# Patient Record
Sex: Male | Born: 1955 | Race: White | Hispanic: No | Marital: Married | Smoking: Never smoker
Health system: Southern US, Community
[De-identification: ages and names within clinical notes are randomized; demographics above are authoritative.]

## PROBLEM LIST (undated history)

## (undated) DIAGNOSIS — M1990 Unspecified osteoarthritis, unspecified site: Secondary | ICD-10-CM

## (undated) HISTORY — PX: COLONOSCOPY: SHX174

## (undated) HISTORY — PX: POLYPECTOMY: SHX149

## (undated) HISTORY — PX: OTHER SURGICAL HISTORY: SHX169

## (undated) HISTORY — DX: Unspecified osteoarthritis, unspecified site: M19.90

---

## 2007-05-18 ENCOUNTER — Ambulatory Visit: Payer: Self-pay | Admitting: Gastroenterology

## 2007-05-24 ENCOUNTER — Encounter: Payer: Self-pay | Admitting: Gastroenterology

## 2007-05-24 ENCOUNTER — Ambulatory Visit: Payer: Self-pay | Admitting: Gastroenterology

## 2007-06-05 ENCOUNTER — Encounter: Payer: Self-pay | Admitting: Gastroenterology

## 2008-12-10 ENCOUNTER — Ambulatory Visit: Payer: Self-pay | Admitting: Diagnostic Radiology

## 2008-12-10 ENCOUNTER — Emergency Department (HOSPITAL_BASED_OUTPATIENT_CLINIC_OR_DEPARTMENT_OTHER): Admission: EM | Admit: 2008-12-10 | Discharge: 2008-12-10 | Payer: Self-pay | Admitting: Emergency Medicine

## 2010-05-13 LAB — POCT CARDIAC MARKERS
Myoglobin, poc: 40.9 ng/mL (ref 12–200)
Myoglobin, poc: 58.9 ng/mL (ref 12–200)

## 2010-05-13 LAB — DIFFERENTIAL
Basophils Relative: 1 % (ref 0–1)
Eosinophils Relative: 3 % (ref 0–5)
Lymphs Abs: 2.2 10*3/uL (ref 0.7–4.0)
Monocytes Relative: 8 % (ref 3–12)
Neutro Abs: 3.9 10*3/uL (ref 1.7–7.7)

## 2010-05-13 LAB — BASIC METABOLIC PANEL
Calcium: 8.6 mg/dL (ref 8.4–10.5)
Creatinine, Ser: 1 mg/dL (ref 0.4–1.5)
GFR calc Af Amer: >60 mL/min (ref >60)
GFR calc non Af Amer: >60 mL/min (ref >60)
Potassium: 4 mEq/L (ref 3.5–5.1)

## 2010-05-13 LAB — CBC: MCHC: 33.4 g/dL (ref 30.0–36.0)

## 2012-10-10 ENCOUNTER — Ambulatory Visit (AMBULATORY_SURGERY_CENTER): Payer: BC Managed Care – PPO

## 2012-10-10 VITALS — Ht 71.5 in | Wt 199.4 lb

## 2012-10-10 DIAGNOSIS — Z86010 Personal history of colonic polyps: Secondary | ICD-10-CM

## 2012-10-10 MED ORDER — PREPOPIK 10-3.5-12 MG-GM-GM PO PACK: 1.0000 | PACK | ORAL | Status: DC

## 2012-10-11 ENCOUNTER — Encounter: Payer: Self-pay | Admitting: Gastroenterology

## 2012-10-19 ENCOUNTER — Encounter: Payer: BC Managed Care – PPO | Admitting: Gastroenterology

## 2012-11-09 ENCOUNTER — Encounter: Payer: Self-pay | Admitting: *Deleted

## 2012-12-07 ENCOUNTER — Encounter: Payer: BC Managed Care – PPO | Admitting: Gastroenterology

## 2013-01-15 ENCOUNTER — Encounter: Payer: Self-pay | Admitting: Gastroenterology

## 2013-01-15 ENCOUNTER — Ambulatory Visit (AMBULATORY_SURGERY_CENTER): Payer: BC Managed Care – PPO | Admitting: Gastroenterology

## 2013-01-15 VITALS — BP 116/85 | HR 65 | Temp 97.8°F | Resp 15 | Ht 71.0 in | Wt 199.0 lb

## 2013-01-15 DIAGNOSIS — D126 Benign neoplasm of colon, unspecified: Secondary | ICD-10-CM

## 2013-01-15 DIAGNOSIS — K648 Other hemorrhoids: Secondary | ICD-10-CM

## 2013-01-15 DIAGNOSIS — Z86010 Personal history of colonic polyps: Secondary | ICD-10-CM

## 2013-01-15 HISTORY — PX: COLONOSCOPY: SHX174

## 2013-01-15 MED ORDER — SODIUM CHLORIDE 0.9 % IV SOLN: 500.0000 mL | INTRAVENOUS | Status: DC

## 2013-01-15 NOTE — Progress Notes (Signed)
Patient did not experience any of the following events: a burn prior to discharge; a fall within the facility; wrong site/side/patient/procedure/implant event; or a hospital transfer or hospital admission upon discharge from the facility. (G8907) Patient did not have preoperative order for IV antibiotic SSI prophylaxis. (G8918)  

## 2013-01-15 NOTE — Progress Notes (Signed)
Procedure ends, to recovery, report given and VSS. 

## 2013-01-15 NOTE — Progress Notes (Signed)
Called to room to assist during endoscopic procedure.  Patient ID and intended procedure confirmed with present staff. Received instructions for my participation in the procedure from the performing physician.  

## 2013-01-15 NOTE — Op Note (Signed)
[facility] [address] [place], [address]   COLONOSCOPY PROCEDURE REPORT  PATIENT: Derek, [name]  MR#: [id number] BIRTHDATE: 1955/08/10 , 57  yrs. old GENDER: Male ENDOSCOPIST: Meryl Dare, MD, [facility] PROCEDURE DATE:  01/15/2013 PROCEDURE:   Colonoscopy with biopsy, snare polypectomy, hemorrhoidectomy via sclerosing First Screening Colonoscopy - Avg.  risk and is 50 yrs.  old or older - No.  Prior Negative Screening - Now for repeat screening. N/A  History of Adenoma - Now for follow-up colonoscopy & has been > or = to 3 yrs.  Yes hx of adenoma.  Has been 3 or more years since last colonoscopy.  Polyps Removed Today? Yes. ASA CLASS:   Class II INDICATIONS:Patient's personal history of adenomatous colon polyps.  MEDICATIONS: MAC sedation, administered by CRNA and propofol (Diprivan) 300mg  IV DESCRIPTION OF PROCEDURE:   After the risks benefits and alternatives of the procedure were thoroughly explained, informed consent was obtained.  A digital rectal exam revealed no abnormalities of the rectum.   The LB [id number]  endoscope was introduced through the anus and advanced to the cecum, which was identified by both the appendix and ileocecal valve. No adverse events experienced.   The quality of the prep was Prepopik good The instrument was then slowly withdrawn as the colon was fully examined.  COLON FINDINGS: A sessile polyp measuring 5 mm in size was found at the cecum.  A polypectomy was performed with a cold snare.  The resection was complete and the polyp tissue was completely retrieved.   A sessile polyp measuring 4 mm in size was found in the descending colon.  A polypectomy was performed with a cold snare.  The resection was complete and the polyp tissue was completely retrieved.   Two sessile polyps measuring 4-5 mm in size were found in the sigmoid colon.  A polypectomy was performed with a cold snare and with cold forceps.  The resection  was complete and the polyp tissue was completely retrieved.   A sessile polyp measuring 7 mm in size was found in the rectum.  A polypectomy was performed with a cold snare.  The resection was complete and the polyp tissue was completely retrieved.   The colon was otherwise normal.  There was no diverticulosis, inflammation, polyps or cancers unless previously stated.  Retroflexed views revealed small internal hemorrhoids. Given his recent rectal bleeding decision made to proceed with sclerosis. 1.5 cc of 23.4% saline was injected into the internal hemorrhoids well above the dentate line. The time to cecum=1 minutes 58 seconds.  Withdrawal time=15 minutes 57 seconds.  The scope was withdrawn and the procedure completed. COMPLICATIONS: There were no complications.  ENDOSCOPIC IMPRESSION: 1.   Sessile polyp measuring 5 mm at the cecum; polypectomy was performed with a cold snare 2.   Sessile polyp measuring 4 mm in the descending colon; polypectomy was performed with a cold snare 3.   Two sessile polyps measuring 4-5 mm in the sigmoid colon; polypectomy performed with a cold snare and with cold forceps 4.   Sessile polyp measuring 7 mm in the rectum; polypectomy performed with a cold snare 5.    Internal hemorrhoids; injection sclerosis performed  RECOMMENDATIONS: 1.  Await pathology results 2.  Repeat Colonoscopy in 3-5 years pending pathology review.   eSigned:  Meryl Dare, MD, [facility] 01/15/2013 2:06 PM   cc: Lindaann Slough, MD   PATIENT NAME:  Derek, [name] MR#: [id number]

## 2013-01-15 NOTE — Patient Instructions (Signed)
Discharge instructions given with verbal understanding. Handouts on polyps and hemorrhoids. Resume previous medications. YOU HAD AN ENDOSCOPIC PROCEDURE TODAY AT THE [facility]: Refer to the procedure report that was given to you for any specific questions about what was found during the examination.  If the procedure report does not answer your questions, please call your gastroenterologist to clarify.  If you requested that your care partner not be given the details of your procedure findings, then the procedure report has been included in a sealed envelope for you to review at your convenience later.  YOU SHOULD EXPECT: Some feelings of bloating in the abdomen. Passage of more gas than usual.  Walking can help get rid of the air that was put into your GI tract during the procedure and reduce the bloating. If you had a lower endoscopy (such as a colonoscopy or flexible sigmoidoscopy) you may notice spotting of blood in your stool or on the toilet paper. If you underwent a bowel prep for your procedure, then you may not have a normal bowel movement for a few days.  DIET: Your first meal following the procedure should be a light meal and then it is ok to progress to your normal diet.  A half-sandwich or bowl of soup is an example of a good first meal.  Heavy or fried foods are harder to digest and may make you feel nauseous or bloated.  Likewise meals heavy in dairy and vegetables can cause extra gas to form and this can also increase the bloating.  Drink plenty of fluids but you should avoid alcoholic beverages for 24 hours.  ACTIVITY: Your care partner should take you home directly after the procedure.  You should plan to take it easy, moving slowly for the rest of the day.  You can resume normal activity the day after the procedure however you should NOT DRIVE or use heavy machinery for 24 hours (because of the sedation medicines used during the test).    SYMPTOMS TO REPORT  IMMEDIATELY: A gastroenterologist can be reached at any hour.  During normal business hours, 8:30 AM to 5:00 PM Monday through Friday, call [phone].  After hours and on weekends, please call the GI answering service at [phone] who will take a message and have the physician on call contact you.   Following lower endoscopy (colonoscopy or flexible sigmoidoscopy):  Excessive amounts of blood in the stool  Significant tenderness or worsening of abdominal pains  Swelling of the abdomen that is new, acute  Fever of 100F or higher  FOLLOW UP: If any biopsies were taken you will be contacted by phone or by letter within the next 1-3 weeks.  Call your gastroenterologist if you have not heard about the biopsies in 3 weeks.  Our staff will call the home number listed on your records the next business day following your procedure to check on you and address any questions or concerns that you may have at that time regarding the information given to you following your procedure. This is a courtesy call and so if there is no answer at the home number and we have not heard from you through the emergency physician on call, we will assume that you have returned to your regular daily activities without incident.  SIGNATURES/CONFIDENTIALITY: You and/or your care partner have signed paperwork which will be entered into your electronic medical record.  These signatures attest to the fact that that the information above on your After Visit Summary   has been reviewed and is understood.  Full responsibility of the confidentiality of this discharge information lies with you and/or your care-partner. 

## 2013-01-16 ENCOUNTER — Telehealth: Payer: Self-pay

## 2013-01-16 NOTE — Telephone Encounter (Signed)
  Follow up Call-  Call back number 01/15/2013  Post procedure Call Back phone  # [phone]  Permission to leave phone message Yes     Patient questions:  Do you have a fever, pain , or abdominal swelling? no Pain Score  0 *  Have you tolerated food without any problems? yes  Have you been able to return to your normal activities? yes  Do you have any questions about your discharge instructions: Diet   no Medications  no Follow up visit  no  Do you have questions or concerns about your Care? no  Actions: * If pain score is 4 or above: No action needed, pain <4.

## 2013-01-17 ENCOUNTER — Encounter: Payer: BC Managed Care – PPO | Admitting: Gastroenterology

## 2013-01-23 ENCOUNTER — Encounter: Payer: Self-pay | Admitting: Gastroenterology

## 2014-06-14 ENCOUNTER — Encounter: Payer: Self-pay | Admitting: Gastroenterology

## 2017-01-03 NOTE — Progress Notes (Signed)
[name] [address] [place], [place] [address] Phone: [phone] Subjective:    I'm seeing this patient by the request  of:  Maylon Peppers, MD   CC: Right leg pain  [id number]  Derek [name] is a 61 y.o. male coming in with complaint of  leg pain that started 3 weeks ago. Patient saw his PCP and was told that he has arthritis in his back. He has radicular symptoms on the right leg all the way to his foot, both tingling and numbness. His foot is numb from the 2nd-5th toes as well as the posterior aspect of his hamstring. He has been taking a muscle relaxer Norflex since last week. He does not like the way it makes him feel. He also has had flexogenix shots in the right knee due to arthritis. He stated that the knee still bothers him but is better with activity.  Patient is concerned because he is decreasing his walking child as well.  States not doing well with the over-the-counter medications   Did have x-rays of the lumbar spine done December 29, 2016.  Found to have significant osteoarthritic changes at most levels especially at L4-L5.  No past medical history on file. Past Surgical History:  Procedure Laterality Date  . COLONOSCOPY    . POLYPECTOMY    . thumb surg     right thumb   Social History   Socioeconomic History  . Marital status: Married    Spouse name: None  . Number of children: None  . Years of education: None  . Highest education level: None  Social Needs  . Financial resource strain: None  . Food insecurity - worry: None  . Food insecurity - inability: None  . Transportation needs - medical: None  . Transportation needs - non-medical: None  Occupational History  . None  Tobacco Use  . Smoking status: Never Smoker  . Smokeless tobacco: Never Used  Substance and Sexual Activity  . Alcohol use: Yes    Alcohol/week: 7.2 oz    Types: 12 Cans of beer per week  . Drug use: No  . Sexual activity: None  Other Topics  Concern  . None  Social History Narrative  . None   No Known Allergies Family History  Problem Relation Age of Onset  . Transient ischemic attack Mother      Past medical history, social, surgical and family history all reviewed in electronic medical record.  No pertanent information unless stated regarding to the chief complaint.   Review of Systems:Review of systems updated and as accurate as of 01/04/17  No headache, visual changes, nausea, vomiting, diarrhea, constipation, dizziness, abdominal pain, skin rash, fevers, chills, night sweats, weight loss, swollen lymph nodes, body aches, joint swelling, muscle aches, chest pain, shortness of breath, mood changes.   Objective  Blood pressure 138/82, pulse 94, height 5\' 11"  (1.803 m), weight 206 lb (93.4 kg), SpO2 98 %. Systems examined below as of 01/04/17   General: No apparent distress alert and oriented x3 mood and affect normal, dressed appropriately.  HEENT: Pupils equal, extraocular movements intact  Respiratory: Patient's speak in full sentences and does not appear short of breath  Cardiovascular: No lower extremity edema, non tender, no erythema  Skin: Warm dry intact with no signs of infection or rash on extremities or on axial skeleton.  Abdomen: Soft nontender  Neuro: Cranial nerves II through XII are intact, neurovascularly intact in all extremities with 2+ DTRs  and 2+ pulses.  Lymph: No lymphadenopathy of posterior or anterior cervical chain or axillae bilaterally.  Gait antalgic.  MSK:  Non tender with full range of motion and good stability and symmetric strength and tone of shoulders, elbows, wrist, hip, and ankles bilaterally.  Significant arthritic changes in multiple joints.  Patient does have very minimal internal range of motion with the right hip.  Patient's right knee does have significant arthritis Patient does have some instability of the right leg with walking.  Back exam shows the patient does have some  mild degenerative scoliosis noted.  Positive straight leg test on the right side.  Patient also has some atrophy of the leg on the right side.  This includes the quadricep, hamstring, as well as calf.  Patient does have 4 out of 5 strength of the foot compared to the contralateral side.  Deep tendon reflexes are intact.   Impression and Recommendations:     This case required medical decision making of moderate complexity.      Note: This dictation was prepared with Dragon dictation along with smaller phrase technology. Any transcriptional errors that result from this process are unintentional.

## 2017-01-04 ENCOUNTER — Ambulatory Visit: Payer: Self-pay

## 2017-01-04 ENCOUNTER — Ambulatory Visit: Payer: 59 | Admitting: Family Medicine

## 2017-01-04 ENCOUNTER — Encounter: Payer: Self-pay | Admitting: Family Medicine

## 2017-01-04 VITALS — BP 138/82 | HR 94 | Ht 71.0 in | Wt 206.0 lb

## 2017-01-04 DIAGNOSIS — R531 Weakness: Secondary | ICD-10-CM | POA: Diagnosis not present

## 2017-01-04 DIAGNOSIS — M5416 Radiculopathy, lumbar region: Secondary | ICD-10-CM | POA: Diagnosis not present

## 2017-01-04 DIAGNOSIS — M79606 Pain in leg, unspecified: Secondary | ICD-10-CM

## 2017-01-04 MED ORDER — MELOXICAM 15 MG PO TABS: 15.0000 mg | ORAL_TABLET | Freq: Every day | ORAL | 0 refills | Status: DC

## 2017-01-04 MED ORDER — GABAPENTIN 100 MG PO CAPS: 200.0000 mg | ORAL_CAPSULE | Freq: Every day | ORAL | 3 refills | Status: DC

## 2017-01-04 NOTE — Patient Instructions (Addendum)
Good to see you  We will get MRI soon.  Gabapentin 200mg  at night  Ice 20 minutes 2 times daily. Usually after activity and before bed. meloxicam daily for 10 days I will write you when I get the results and discuss next steps

## 2017-01-04 NOTE — Assessment & Plan Note (Signed)
Patient does have lumbar radiculopathy as well as has atrophy of the musculature of the lower extremity.  Concerned with this as well as the weakness.  Patient has constant numbness of toes 2 through 5.  Patient's x-rays are concerning for an L4-L5 degenerative disc disease that is likely causing some type of nerve impingement.  I do feel that advanced imaging would be warranted because patient would be a candidate for an epidural or possible nerve root injection.  Patient will continue some of the home exercises that was given to him by another provider and also started on gabapentin and meloxicam.  I do feel with the constant numbness and weakness that the advanced imaging is warranted now.  Depending on the findings we will discuss further medical management.

## 2017-01-13 ENCOUNTER — Other Ambulatory Visit: Payer: Self-pay | Admitting: Family Medicine

## 2017-01-15 ENCOUNTER — Ambulatory Visit
Admission: RE | Admit: 2017-01-15 | Discharge: 2017-01-15 | Disposition: A | Discharge status: Home / Self Care | Payer: 59 | Source: Ambulatory Visit | Attending: Family Medicine | Admitting: Family Medicine

## 2017-01-15 DIAGNOSIS — M5416 Radiculopathy, lumbar region: Secondary | ICD-10-CM

## 2017-01-15 DIAGNOSIS — R531 Weakness: Secondary | ICD-10-CM

## 2017-01-20 ENCOUNTER — Encounter: Payer: Self-pay | Admitting: Family Medicine

## 2017-01-20 DIAGNOSIS — M5416 Radiculopathy, lumbar region: Secondary | ICD-10-CM

## 2017-02-10 ENCOUNTER — Encounter: Payer: Self-pay | Admitting: Family Medicine

## 2017-03-01 ENCOUNTER — Ambulatory Visit
Admission: RE | Admit: 2017-03-01 | Discharge: 2017-03-01 | Disposition: A | Discharge status: Home / Self Care | Payer: 59 | Source: Ambulatory Visit | Attending: Family Medicine | Admitting: Family Medicine

## 2017-03-01 DIAGNOSIS — M5416 Radiculopathy, lumbar region: Secondary | ICD-10-CM

## 2017-03-01 MED ORDER — IOPAMIDOL (ISOVUE-M 200) INJECTION 41%
1.0000 mL | Freq: Once | INTRAMUSCULAR | Status: AC
  Administered 2017-03-01: 1 mL via EPIDURAL

## 2017-03-01 MED ORDER — METHYLPREDNISOLONE ACETATE 40 MG/ML INJ SUSP (RADIOLOG
120.0000 mg | Freq: Once | INTRAMUSCULAR | Status: AC
  Administered 2017-03-01: 120 mg via EPIDURAL

## 2017-03-01 NOTE — Discharge Instructions (Signed)

## 2017-05-31 ENCOUNTER — Encounter: Payer: Self-pay | Admitting: Family Medicine

## 2017-11-09 ENCOUNTER — Ambulatory Visit: Payer: 59 | Admitting: Family Medicine

## 2017-11-12 NOTE — Progress Notes (Signed)
[name] [address] [place], [place] [address] Phone: [phone] Subjective:   Derek [name], am serving as a scribe for Dr. Hulan Saas.   CC: Back pain follow-up  [id number]  Derek [name] is a 62 y.o. male coming in with complaint of back pain. Patient said that he had an epidural which did not help. Continues to have pain all the way down the right leg along with numbness in that levg. Constant pain in all positions. Patient also is complaining of left heel pain. Patient does work on a ladder quite a bit. Pain in the heel improves when sedentary.   Patient also complains of right knee pain.  Has severe arthritis in the knee.  Was told he needs replacement.  Wondering what else he can do.  Saw another provider and was given injections with Flexogenix patient did not see improvement with this.  Patient has pain on a daily basis.  Does not know what it is from his knee and what is from his back.     [name] past medical history on file. Past Surgical History:  Procedure Laterality Date  . COLONOSCOPY    . POLYPECTOMY    . thumb surg     right thumb   Social History   Socioeconomic History  . Marital status: Married    Spouse name: Not on file  . Number of children: Not on file  . Years of education: Not on file  . Highest education level: Not on file  Occupational History  . Not on file  Social Needs  . Financial resource strain: Not on file  . Food insecurity:    Worry: Not on file    Inability: Not on file  . Transportation needs:    Medical: Not on file    Non-medical: Not on file  Tobacco Use  . Smoking status: Never Smoker  . Smokeless tobacco: Never Used  Substance and Sexual Activity  . Alcohol use: Yes    Alcohol/week: 12.0 standard drinks    Types: 12 Cans of beer per week  . Drug use: [name]  . Sexual activity: Not on file  Lifestyle  . Physical activity:    Days per week: Not on file    Minutes per session: Not on  file  . Stress: Not on file  Relationships  . Social connections:    Talks on phone: Not on file    Gets together: Not on file    Attends religious service: Not on file    Active member of club or organization: Not on file    Attends meetings of clubs or organizations: Not on file    Relationship status: Not on file  Other Topics Concern  . Not on file  Social History Narrative  . Not on file   [name] Known Allergies Family History  Problem Relation Age of Onset  . Transient ischemic attack Mother        Current Outpatient Medications (Analgesics):  .  meloxicam (MOBIC) 15 MG tablet, Take 1 tablet (15 mg total) by mouth daily.   Current Outpatient Medications (Other):  .  gabapentin (NEURONTIN) 100 MG capsule, Take 2 capsules (200 mg total) by mouth at bedtime. .  orphenadrine (NORFLEX) 100 MG tablet, Take 100 mg by mouth 2 (two) times daily.    Past medical history, social, surgical and family history all reviewed in electronic medical record.  [name] pertanent information unless stated regarding to the chief complaint.  Review of Systems:  [name] headache, visual changes, nausea, vomiting, diarrhea, constipation, dizziness, abdominal pain, skin rash, fevers, chills, night sweats, weight loss, swollen lymph nodes, body aches, joint swelling, chest pain, shortness of breath, mood changes.  Positive muscle aches  Objective  Blood pressure (!) 138/94, pulse 65, height 5\' 11"  (1.803 m), weight 203 lb (92.1 kg), SpO2 98 %.    General: [name] apparent distress alert and oriented x3 mood and affect normal, dressed appropriately.  HEENT: Pupils equal, extraocular movements intact  Respiratory: Patient's speak in full sentences and does not appear short of breath  Cardiovascular: [name] lower extremity edema, non tender, [name] erythema  Skin: Warm dry intact with [name] signs of infection or rash on extremities or on axial skeleton.  Abdomen: Soft nontender  Neuro: Cranial nerves II through XII are  intact, neurovascularly intact in all extremities with 2+ DTRs and 2+ pulses.  Lymph: [name] lymphadenopathy of posterior or anterior cervical chain or axillae bilaterally.  Gait antalgic MSK:  Non tender with full range of motion and good stability and symmetric strength and tone of shoulders, elbows, wrist, hip, and ankles bilaterally.  Knee: Right valgus deformity noted.  Abnormal thigh to calf ratio.  Tender to palpation over medial and PF joint line.  ROM full in flexion and extension and lower leg rotation. instability with valgus force.  painful patellar compression. Patellar glide with moderate crepitus. Patellar and quadriceps tendons unremarkable. Hamstring and quadriceps strength is normal. Contralateral knee shows mild arthritic changes  Back exam shows loss of lordosis.  Tenderness to palpation the paraspinal musculature on the right side of the lumbar spine.  Positive straight leg test on the right side at 20 degrees.  Patient does have some mild weakness with plantar flexion on the right compared to left with 4 out of 5 strength.  Deep tendon reflexes are intact     Impression and Recommendations:     This case required medical decision making of moderate complexity. The above documentation has been reviewed and is accurate and complete Lyndal Pulley, DO       Note: This dictation was prepared with Dragon dictation along with smaller phrase technology. Any transcriptional errors that result from this process are unintentional.

## 2017-11-14 ENCOUNTER — Ambulatory Visit (INDEPENDENT_AMBULATORY_CARE_PROVIDER_SITE_OTHER): Payer: 59 | Admitting: Family Medicine

## 2017-11-14 ENCOUNTER — Encounter: Payer: Self-pay | Admitting: Family Medicine

## 2017-11-14 VITALS — BP 138/94 | HR 65 | Ht 71.0 in | Wt 203.0 lb

## 2017-11-14 DIAGNOSIS — M5416 Radiculopathy, lumbar region: Secondary | ICD-10-CM | POA: Diagnosis not present

## 2017-11-14 DIAGNOSIS — M1711 Unilateral primary osteoarthritis, right knee: Secondary | ICD-10-CM

## 2017-11-14 NOTE — Assessment & Plan Note (Signed)
Significant instability and abnormal thigh to calf ratio.  Custom bracing ordered today.  Patient did try Visco supplementation from an outside facility with no significant improvement.  Patient wants to avoid surgical intervention.  Follow-up again in 3 to 4 weeks and worsening symptoms consider injection.

## 2017-11-14 NOTE — Assessment & Plan Note (Signed)
Patient continues to have lumbar radiculopathy.  Did not respond to the L5 nerve root injection.  We will try and S1 nerve root injection for diagnostic and therapeutic purposes.  Discussed with patient about the potential need for surgical intervention if this continues.  Patient also has significant arthritic changes of the right knee that I think is contributing to some of the discomfort and pain.  We will get patient a custom brace secondary to the abnormal thigh to calf ratio and the instability of the knee.  Discussed patient following up again in 3 to 4 weeks afterwards.  Patient has been noncompliant on medications and will not make any significant changes at this time.

## 2017-11-14 NOTE — Patient Instructions (Signed)
Good to see you  I am sorry you are still hurting. We will get you a custom brace for the knee and see how much that is playing a role Will try another nerve injection.  Call them at [phone] See me again in 3-4 weeks

## 2017-11-24 ENCOUNTER — Ambulatory Visit
Admission: RE | Admit: 2017-11-24 | Discharge: 2017-11-24 | Disposition: A | Discharge status: Home / Self Care | Payer: 59 | Source: Ambulatory Visit | Attending: Family Medicine | Admitting: Family Medicine

## 2017-11-24 DIAGNOSIS — M5416 Radiculopathy, lumbar region: Secondary | ICD-10-CM

## 2017-11-24 MED ORDER — METHYLPREDNISOLONE ACETATE 40 MG/ML INJ SUSP (RADIOLOG
120.0000 mg | Freq: Once | INTRAMUSCULAR | Status: AC
  Administered 2017-11-24: 120 mg via EPIDURAL

## 2017-11-24 MED ORDER — IOPAMIDOL (ISOVUE-M 200) INJECTION 41%
1.0000 mL | Freq: Once | INTRAMUSCULAR | Status: AC
  Administered 2017-11-24: 1 mL via EPIDURAL

## 2017-11-24 NOTE — Discharge Instructions (Signed)

## 2017-12-07 ENCOUNTER — Encounter: Payer: Self-pay | Admitting: Family Medicine

## 2017-12-26 ENCOUNTER — Ambulatory Visit (INDEPENDENT_AMBULATORY_CARE_PROVIDER_SITE_OTHER): Payer: 59 | Admitting: Family Medicine

## 2017-12-26 ENCOUNTER — Encounter: Payer: Self-pay | Admitting: Family Medicine

## 2017-12-26 DIAGNOSIS — M7662 Achilles tendinitis, left leg: Secondary | ICD-10-CM | POA: Diagnosis not present

## 2017-12-26 DIAGNOSIS — M5416 Radiculopathy, lumbar region: Secondary | ICD-10-CM

## 2017-12-26 DIAGNOSIS — M1711 Unilateral primary osteoarthritis, right knee: Secondary | ICD-10-CM

## 2017-12-26 MED ORDER — VITAMIN D (ERGOCALCIFEROL) 1.25 MG (50000 UNIT) PO CAPS: 50000.0000 [IU] | ORAL_CAPSULE | ORAL | 0 refills | Status: DC

## 2017-12-26 NOTE — Assessment & Plan Note (Signed)
Stable at the moment.  Discussed icing regimen and home exercise.  Discussed objective residual which wants to avoid.  Patient did not respond well to the gabapentin.  Encourage patient to continue to be active.  Hopefully will continue to improve.

## 2017-12-26 NOTE — Patient Instructions (Addendum)
Good to see you  Ice is your friend Once weekly vitamin D for 12 weeks Avoid the heel lift Exercises 3 times a week.  pennsaid pinkie amount topically 2 times daily as needed.   The knee looks good  Back is better as well  See me again in 6 weeks

## 2017-12-26 NOTE — Progress Notes (Signed)
[name] [address] [place], [place] [address] Phone: [phone] Subjective:    I Derek [name] am serving as a Education administrator for Dr. Hulan Saas.   CC: Back pain  [id number]  Franko Hilliker is a 62 y.o. male coming in with complaint of back pain. States the back is not so bad today. Hasn't been doing as much walking. Has some numbness in his right foot. Radiating pain has resolved.  Patient has been wearing the knee brace on a more regular basis.  Feels significant improvement.  Great improvement after the epidural of the S1 nerve root.  The injection has helped.  Still has the numbness.     No past medical history on file. Past Surgical History:  Procedure Laterality Date  . COLONOSCOPY    . POLYPECTOMY    . thumb surg     right thumb   Social History   Socioeconomic History  . Marital status: Married    Spouse name: Not on file  . Number of children: Not on file  . Years of education: Not on file  . Highest education level: Not on file  Occupational History  . Not on file  Social Needs  . Financial resource strain: Not on file  . Food insecurity:    Worry: Not on file    Inability: Not on file  . Transportation needs:    Medical: Not on file    Non-medical: Not on file  Tobacco Use  . Smoking status: Never Smoker  . Smokeless tobacco: Never Used  Substance and Sexual Activity  . Alcohol use: Yes    Alcohol/week: 12.0 standard drinks    Types: 12 Cans of beer per week  . Drug use: No  . Sexual activity: Not on file  Lifestyle  . Physical activity:    Days per week: Not on file    Minutes per session: Not on file  . Stress: Not on file  Relationships  . Social connections:    Talks on phone: Not on file    Gets together: Not on file    Attends religious service: Not on file    Active member of club or organization: Not on file    Attends meetings of clubs or organizations: Not on file    Relationship status: Not on  file  Other Topics Concern  . Not on file  Social History Narrative  . Not on file   No Known Allergies Family History  Problem Relation Age of Onset  . Transient ischemic attack Mother          Current Outpatient Medications (Other):  Marland Kitchen  Vitamin D, Ergocalciferol, (DRISDOL) 1.25 MG (50000 UT) CAPS capsule, Take 1 capsule (50,000 Units total) by mouth every 7 (seven) days.    Past medical history, social, surgical and family history all reviewed in electronic medical record.  No pertanent information unless stated regarding to the chief complaint.   Review of Systems:  No headache, visual changes, nausea, vomiting, diarrhea, constipation, dizziness, abdominal pain, skin rash, fevers, chills, night sweats, weight loss, swollen lymph nodes, body aches, joint swelling, chest pain, shortness of breath, mood changes.  Positive muscle aches  Objective  Blood pressure 122/84, pulse 83, height 5\' 11"  (8.657 m), weight 205 lb (93 kg), SpO2 98 %.    General: No apparent distress alert and oriented x3 mood and affect normal, dressed appropriately.  HEENT: Pupils equal, extraocular movements intact  Respiratory: Patient's speak in full  sentences and does not appear short of breath  Cardiovascular: No lower extremity edema, non tender, no erythema  Skin: Warm dry intact with no signs of infection or rash on extremities or on axial skeleton.  Abdomen: Soft nontender  Neuro: Cranial nerves II through XII are intact, neurovascularly intact in all extremities with 2+ DTRs and 2+ pulses.  Lymph: No lymphadenopathy of posterior or anterior cervical chain or axillae bilaterally.  Gait mild antalgic MSK:  Non tender with full range of motion and good stability and symmetric strength and tone of shoulders, elbows, wrist, hip, and bilaterally.  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 Large thigh to calf ratio.  Tender to palpation over medial and  PF joint line.  ROM full in flexion and extension and lower leg rotation. instability with valgus force.  painful patellar compression. Patellar glide with moderate crepitus. Patellar and quadriceps tendons unremarkable. Hamstring and quadriceps strength is normal. Contralateral knee shows mild actinic changes  Back exam shows some degenerative disc disease and tenderness to palpation in the paraspinal musculature.  Decreased range of motion in all planes apparently is 5 to 10 degrees.  Patient still has foot numbness but no significant radicular symptoms noted on exam today.  Seems to have near full strength.  Tightness of the left heel Pain   Impression and Recommendations:     This case required medical decision making of moderate complexity. The above documentation has been reviewed and is accurate and complete Lyndal Pulley, DO       Note: This dictation was prepared with Dragon dictation along with smaller phrase technology. Any transcriptional errors that result from this process are unintentional.

## 2017-12-26 NOTE — Assessment & Plan Note (Signed)
Arthritic changes noted as well.  Continue non-custom bracing.  Patient does not want injections at this point.  Continue conservative therapy otherwise

## 2017-12-26 NOTE — Assessment & Plan Note (Signed)
Discussed icing regimen, home exercises, which activities to do which was to void.  Increase activity slowly.  Follow-up again in 3 to 4 weeks

## 2018-01-09 ENCOUNTER — Encounter: Payer: Self-pay | Admitting: Gastroenterology

## 2018-02-12 ENCOUNTER — Encounter: Payer: Self-pay | Admitting: Gastroenterology

## 2018-02-14 ENCOUNTER — Ambulatory Visit: Payer: 59 | Admitting: Family Medicine

## 2018-02-15 ENCOUNTER — Telehealth (INDEPENDENT_AMBULATORY_CARE_PROVIDER_SITE_OTHER): Payer: Self-pay | Admitting: Orthopaedic Surgery

## 2018-02-15 NOTE — Telephone Encounter (Signed)
Xray CD shipped [employer] today to [facility]. Records were faxed 02/09/18. Request and AR scanned in chart. Pickup# [id number]

## 2018-02-27 ENCOUNTER — Encounter: Payer: Self-pay | Admitting: Family Medicine

## 2018-02-27 ENCOUNTER — Ambulatory Visit (INDEPENDENT_AMBULATORY_CARE_PROVIDER_SITE_OTHER): Payer: 59 | Admitting: Family Medicine

## 2018-02-27 DIAGNOSIS — M1711 Unilateral primary osteoarthritis, right knee: Secondary | ICD-10-CM

## 2018-02-27 NOTE — Assessment & Plan Note (Signed)
Patient does have significant arthritic changes.  Patient does have increasing instability.  We will see if patient can have his brace adjusted and hopefully this will make some benefit.  Patient with a hold on any surgical intervention with him being 63 years of age.  We did discuss though that that may be the best opportunity for him.  Patient does not want to do steroid injections again.  PRP was possibly suggestive of the once again very experimental and with patient's amount of arthritis I do not think will make a significant difference.  Patient will try to make these changes and follow-up with me again in 8 weeks

## 2018-02-27 NOTE — Progress Notes (Signed)
[name] [address] [place], [place] [address] Phone: [phone] Subjective:    I Derek [name] am serving as a Education administrator for Dr. Hulan Saas.    CC: knee and ankle pain   [id number]  Derek [name] is a 63 y.o. male coming in with complaint of back and right knee pain. Knee always hurts and the right foot is numb. Wearing custom brace. Back is doing better. Has been stretching.  Knee pain known arthritic changes.  Patient was put in a custom brace.  Feels like it continues to move throughout the day. Abnormal stress and adjustment neck to be done.  Patient wants to hold on any type of surgical intervention but feels like he is never without pain.  Patient is unfortunately continuing to have discomfort and some instability.  Has to wear the brace daily.     No past medical history on file. Past Surgical History:  Procedure Laterality Date  . COLONOSCOPY    . POLYPECTOMY    . thumb surg     right thumb   Social History   Socioeconomic History  . Marital status: Married    Spouse name: Not on file  . Number of children: Not on file  . Years of education: Not on file  . Highest education level: Not on file  Occupational History  . Not on file  Social Needs  . Financial resource strain: Not on file  . Food insecurity:    Worry: Not on file    Inability: Not on file  . Transportation needs:    Medical: Not on file    Non-medical: Not on file  Tobacco Use  . Smoking status: Never Smoker  . Smokeless tobacco: Never Used  Substance and Sexual Activity  . Alcohol use: Yes    Alcohol/week: 12.0 standard drinks    Types: 12 Cans of beer per week  . Drug use: No  . Sexual activity: Not on file  Lifestyle  . Physical activity:    Days per week: Not on file    Minutes per session: Not on file  . Stress: Not on file  Relationships  . Social connections:    Talks on phone: Not on file    Gets together: Not on file    Attends  religious service: Not on file    Active member of club or organization: Not on file    Attends meetings of clubs or organizations: Not on file    Relationship status: Not on file  Other Topics Concern  . Not on file  Social History Narrative  . Not on file   No Known Allergies Family History  Problem Relation Age of Onset  . Transient ischemic attack Mother          Current Outpatient Medications (Other):  Marland Kitchen  Vitamin D, Ergocalciferol, (DRISDOL) 1.25 MG (50000 UT) CAPS capsule, Take 1 capsule (50,000 Units total) by mouth every 7 (seven) days.    Past medical history, social, surgical and family history all reviewed in electronic medical record.  No pertanent information unless stated regarding to the chief complaint.   Review of Systems:  No headache, visual changes, nausea, vomiting, diarrhea, constipation, dizziness, abdominal pain, skin rash, fevers, chills, night sweats, weight loss, swollen lymph nodes, body aches, joint swelling, muscle aches, chest pain, shortness of breath, mood changes.   Objective  Blood pressure 140/74, pulse 67, height 5\' 11"  (1.803 m), weight 199 lb (90.3 kg), SpO2 97 %.  General: No apparent distress alert and oriented x3 mood and affect normal, dressed appropriately.  HEENT: Pupils equal, extraocular movements intact  Respiratory: Patient's speak in full sentences and does not appear short of breath  Cardiovascular: No lower extremity edema, non tender, no erythema  Skin: Warm dry intact with no signs of infection or rash on extremities or on axial skeleton.  Abdomen: Soft nontender  Neuro: Cranial nerves II through XII are intact, neurovascularly intact in all extremities with 2+ DTRs and 2+ pulses.  Lymph: No lymphadenopathy of posterior or anterior cervical chain or axillae bilaterally.  Gait antalgic.  MSK:  Non tender with full range of motion and good stability and symmetric strength and tone of shoulders, elbows, wrist, hip,  bilaterally.    Knee: Right Varus deformity noted.  Abnormal thigh to calf ratio.  Tender to palpation over medial and PF joint line.  ROM full in flexion and extension and lower leg rotation. instability with valgus force.  painful patellar compression. Patellar glide with moderate crepitus. Patellar and quadriceps tendons unremarkable. Hamstring and quadriceps strength is normal. Contralateral knee shows mild arthritic changes   Impression and Recommendations:      The above documentation has been reviewed and is accurate and complete Lyndal Pulley, DO       Note: This dictation was prepared with Dragon dictation along with smaller phrase technology. Any transcriptional errors that result from this process are unintentional.

## 2018-02-27 NOTE — Patient Instructions (Signed)
Sorry  You are hurting Derek [name] or Derek [name] can help with the brace We could try PRP injections or go back to steroid or viscosupplmenetation  Can refer to a surgeon as well if you want to discuss options Otherwise lets check in again in 6-8 weeks

## 2018-03-28 ENCOUNTER — Other Ambulatory Visit: Payer: Self-pay | Admitting: Family Medicine

## 2019-01-01 ENCOUNTER — Encounter: Payer: Self-pay | Admitting: Gastroenterology

## 2019-01-09 ENCOUNTER — Ambulatory Visit (AMBULATORY_SURGERY_CENTER): Payer: 59 | Admitting: *Deleted

## 2019-01-09 ENCOUNTER — Other Ambulatory Visit: Payer: Self-pay

## 2019-01-09 ENCOUNTER — Encounter: Payer: Self-pay | Admitting: Gastroenterology

## 2019-01-09 VITALS — Temp 96.8°F | Ht 71.0 in | Wt 205.0 lb

## 2019-01-09 DIAGNOSIS — Z1159 Encounter for screening for other viral diseases: Secondary | ICD-10-CM

## 2019-01-09 DIAGNOSIS — Z86010 Personal history of colonic polyps: Secondary | ICD-10-CM

## 2019-01-09 MED ORDER — NA SULFATE-K SULFATE-MG SULF 17.5-3.13-1.6 GM/177ML PO SOLN: ORAL | 0 refills | Status: DC

## 2019-01-09 NOTE — Progress Notes (Signed)
Patient is here in-person for PV. Patient denies any allergies to eggs or soy. Patient denies any problems with anesthesia/sedation. Patient denies any oxygen use at home. Patient denies taking any diet/weight loss medications or blood thinners. Patient is not being treated for MRSA or C-diff. EMMI education assisgned to the patient for the procedure, this was explained and instructions given to patient. COVID-19 screening test is on 12/3 230pm, the pt is aware. Pt is aware that care partner will wait in the car during procedure; if they feel like they will be too hot or cold to wait in the car; they may wait in the 4 th floor lobby. Patient is aware to bring only one care partner. We want them to wear a mask (we do not have any that we can provide them), practice social distancing, and we will check their temperatures when they get here.  I did remind the patient that their care partner needs to stay in the parking lot the entire time and have a cell phone available, we will call them when the pt is ready for discharge. Patient will wear mask into building.    Suprep $15 off coupon given to the patient.

## 2019-01-11 ENCOUNTER — Ambulatory Visit (INDEPENDENT_AMBULATORY_CARE_PROVIDER_SITE_OTHER): Payer: 59

## 2019-01-11 DIAGNOSIS — Z1159 Encounter for screening for other viral diseases: Secondary | ICD-10-CM

## 2019-01-12 LAB — SARS CORONAVIRUS 2 (TAT 6-24 HRS): SARS Coronavirus 2: NEGATIVE

## 2019-01-16 ENCOUNTER — Encounter: Payer: Self-pay | Admitting: Gastroenterology

## 2019-01-16 ENCOUNTER — Ambulatory Visit (AMBULATORY_SURGERY_CENTER): Payer: 59 | Admitting: Gastroenterology

## 2019-01-16 ENCOUNTER — Other Ambulatory Visit: Payer: Self-pay

## 2019-01-16 VITALS — BP 109/70 | HR 57 | Temp 98.0°F | Resp 12 | Ht 71.0 in | Wt 205.0 lb

## 2019-01-16 DIAGNOSIS — D120 Benign neoplasm of cecum: Secondary | ICD-10-CM

## 2019-01-16 DIAGNOSIS — D125 Benign neoplasm of sigmoid colon: Secondary | ICD-10-CM

## 2019-01-16 DIAGNOSIS — Z86010 Personal history of colonic polyps: Secondary | ICD-10-CM

## 2019-01-16 DIAGNOSIS — K635 Polyp of colon: Secondary | ICD-10-CM | POA: Diagnosis not present

## 2019-01-16 MED ORDER — SODIUM CHLORIDE 0.9 % IV SOLN: 500.0000 mL | Freq: Once | INTRAVENOUS | Status: DC

## 2019-01-16 NOTE — Op Note (Addendum)
[facility] Patient Name: Derek [name] Procedure Date: 01/16/2019 11:10 AM MRN: [id number] Endoscopist: Ladene Artist , MD Age: 63 Referring MD:  Date of Birth: Mar 21, 1955 Gender: Male Account #: [id number] Procedure:                Colonoscopy Indications:              Surveillance: Personal history of adenomatous                            polyps on last colonoscopy > 5 years ago Medicines:                Monitored Anesthesia Care Procedure:                Pre-Anesthesia Assessment:                           - Prior to the procedure, a History and Physical                            was performed, and patient medications and                            allergies were reviewed. The patient's tolerance of                            previous anesthesia was also reviewed. The risks                            and benefits of the procedure and the sedation                            options and risks were discussed with the patient.                            All questions were answered, and informed consent                            was obtained. Prior Anticoagulants: The patient has                            taken no previous anticoagulant or antiplatelet                            agents. ASA Grade Assessment: II - A patient with                            mild systemic disease. After reviewing the risks                            and benefits, the patient was deemed in                            satisfactory condition to undergo the procedure.  After obtaining informed consent, the colonoscope                            was passed under direct vision. Throughout the                            procedure, the patient's blood pressure, pulse, and                            oxygen saturations were monitored continuously. The                            Colonoscope was introduced through the anus and                            advanced to the the  cecum, identified by                            appendiceal orifice and ileocecal valve. The                            ileocecal valve, appendiceal orifice, and rectum                            were photographed. The quality of the bowel                            preparation was good. The colonoscopy was performed                            without difficulty. The patient tolerated the                            procedure well. Scope In: 11:19:41 AM Scope Out: 11:35:15 AM Scope Withdrawal Time: 0 hours 13 minutes 52 seconds  Total Procedure Duration: 0 hours 15 minutes 34 seconds  Findings:                 The perianal and digital rectal examinations were                            normal.                           Three sessile polyps were found in the sigmoid                            colon (2) and cecum (1). The polyps were 6 to 8 mm                            in size. These polyps were removed with a cold                            snare. Resection and retrieval were complete.  Internal hemorrhoids were found during                            retroflexion. The hemorrhoids were small and Grade                            I (internal hemorrhoids that do not prolapse).                           The exam was otherwise without abnormality on                            direct and retroflexion views. Complications:            No immediate complications. Estimated blood loss:                            None. Estimated Blood Loss:     Estimated blood loss: none. Impression:               - Three 6 to 8 mm polyps in the sigmoid colon and                            in the cecum, removed with a cold snare. Resected                            and retrieved.                           - Internal hemorrhoids.                           - Otherwise without abnormalities on direct and                            retroflexion views. Recommendation:           - Repeat  colonoscopy after studies are complete for                            surveillance based on pathology results.                           - Patient has a contact number available for                            emergencies. The signs and symptoms of potential                            delayed complications were discussed with the                            patient. Return to normal activities tomorrow.                            Written discharge instructions were provided to the  patient.                           - Resume previous diet.                           - Continue present medications.                           - Await pathology results. Ladene Artist, MD 01/16/2019 11:40:01 AM This report has been signed electronically.

## 2019-01-16 NOTE — Progress Notes (Signed)
Pt's states no medical or surgical changes since previsit or office visit.  Jr temp, CW vitals and KW Iv.

## 2019-01-16 NOTE — Progress Notes (Signed)
PT taken to PACU. Monitors in place. VSS. Report given to RN. 

## 2019-01-16 NOTE — Patient Instructions (Signed)
Thank you for allowing [place] to care for you today!  Await pathology results of removed polyps.  Will make recommendation for next colonoscopy at that time.  Resume previous diet and medications today.  Return to your normal activities tomorrow.   YOU HAD AN ENDOSCOPIC PROCEDURE TODAY AT [facility]:   Refer to the procedure report that was given to you for any specific questions about what was found during the examination.  If the procedure report does not answer your questions, please call your gastroenterologist to clarify.  If you requested that your care partner not be given the details of your procedure findings, then the procedure report has been included in a sealed envelope for you to review at your convenience later.  YOU SHOULD EXPECT: Some feelings of bloating in the abdomen. Passage of more gas than usual.  Walking can help get rid of the air that was put into your GI tract during the procedure and reduce the bloating. If you had a lower endoscopy (such as a colonoscopy or flexible sigmoidoscopy) you may notice spotting of blood in your stool or on the toilet paper. If you underwent a bowel prep for your procedure, you may not have a normal bowel movement for a few days.  Please Note:  You might notice some irritation and congestion in your nose or some drainage.  This is from the oxygen used during your procedure.  There is no need for concern and it should clear up in a day or so.  SYMPTOMS TO REPORT IMMEDIATELY:   Following lower endoscopy (colonoscopy or flexible sigmoidoscopy):  Excessive amounts of blood in the stool  Significant tenderness or worsening of abdominal pains  Swelling of the abdomen that is new, acute  Fever of 100F or higher   For urgent or emergent issues, a gastroenterologist can be reached at any hour by calling [phone].   DIET:  We do recommend a small meal at first, but then you may proceed to your regular diet.  Drink plenty of  fluids but you should avoid alcoholic beverages for 24 hours.  ACTIVITY:  You should plan to take it easy for the rest of today and you should NOT DRIVE or use heavy machinery until tomorrow (because of the sedation medicines used during the test).    FOLLOW UP: Our staff will call the number listed on your records 48-72 hours following your procedure to check on you and address any questions or concerns that you may have regarding the information given to you following your procedure. If we do not reach you, we will leave a message.  We will attempt to reach you two times.  During this call, we will ask if you have developed any symptoms of COVID 19. If you develop any symptoms (ie: fever, flu-like symptoms, shortness of breath, cough etc.) before then, please call [phone].  If you test positive for Covid 19 in the 2 weeks post procedure, please call and report this information to [place].    If any biopsies were taken you will be contacted by phone or by letter within the next 1-3 weeks.  Please call us at [phone] if you have not heard about the biopsies in 3 weeks.    SIGNATURES/CONFIDENTIALITY: You and/or your care partner have signed paperwork which will be entered into your electronic medical record.  These signatures attest to the fact that that the information above on your After Visit Summary has been reviewed and is understood.  Full responsibility of the confidentiality of this discharge information lies with you and/or your care-partner.

## 2019-01-16 NOTE — Progress Notes (Signed)
Called to room to assist during endoscopic procedure.  Patient ID and intended procedure confirmed with present staff. Received instructions for my participation in the procedure from the performing physician.  

## 2019-01-18 ENCOUNTER — Telehealth: Payer: Self-pay

## 2019-01-18 NOTE — Telephone Encounter (Signed)
  Follow up Call-  Call back number 01/16/2019  Post procedure Call Back phone  # [phone]  Permission to leave phone message Yes  Some recent data might be hidden     Patient questions:  Do you have a fever, pain , or abdominal swelling? No. Pain Score  0 *  Have you tolerated food without any problems? Yes.    Have you been able to return to your normal activities? Yes.    Do you have any questions about your discharge instructions: Diet   No. Medications  No. Follow up visit  No.  Do you have questions or concerns about your Care? No.  Actions: * If pain score is 4 or above: No action needed, pain <4.  1. Have you developed a fever since your procedure? no  2.   Have you had an respiratory symptoms (SOB or cough) since your procedure? no  3.   Have you tested positive for COVID 19 since your procedure no  4.   Have you had any family members/close contacts diagnosed with the COVID 19 since your procedure?  no   If yes to any of these questions please route to Joylene John, RN and Alphonsa Gin, Therapist, sports.

## 2019-01-24 ENCOUNTER — Encounter: Payer: Self-pay | Admitting: Gastroenterology

## 2020-01-14 ENCOUNTER — Telehealth: Payer: Self-pay | Admitting: Family Medicine

## 2020-01-14 NOTE — Telephone Encounter (Signed)
PT wife called, pt is ready to be referred to a surgeon for his back/sciatica/foot numbness. Per 12/26/17 OV, they would like for Dr. Tamala Julian to recommend and refer to a surgeon.  Pt is happy to come see you if needed ( or would do a virtual visit ). Please respond to wife/Gail at [phone].

## 2020-01-15 NOTE — Telephone Encounter (Signed)
Notified pt wife Baker Janus ) of Dr. Thompson Caul response. She will call [facility].

## 2020-01-15 NOTE — Telephone Encounter (Signed)
With it being so long I would tell him he could call [facility] and see if they would see him with dr. Lynann Bologna.  Otherwise we would have to do full work up

## 2020-01-31 ENCOUNTER — Other Ambulatory Visit: Payer: Self-pay | Admitting: Orthopedic Surgery

## 2020-01-31 DIAGNOSIS — M545 Low back pain, unspecified: Secondary | ICD-10-CM

## 2020-02-23 ENCOUNTER — Ambulatory Visit
Admission: RE | Admit: 2020-02-23 | Discharge: 2020-02-23 | Disposition: A | Discharge status: Home / Self Care | Payer: 59 | Source: Ambulatory Visit | Attending: Orthopedic Surgery | Admitting: Orthopedic Surgery

## 2020-02-23 ENCOUNTER — Other Ambulatory Visit: Payer: Self-pay

## 2020-02-23 DIAGNOSIS — M545 Low back pain, unspecified: Secondary | ICD-10-CM

## 2020-02-24 ENCOUNTER — Inpatient Hospital Stay: Admission: RE | Admit: 2020-02-24 | Payer: 59 | Source: Ambulatory Visit

## 2020-03-06 ENCOUNTER — Other Ambulatory Visit: Payer: Self-pay

## 2020-03-06 ENCOUNTER — Ambulatory Visit: Payer: 59 | Attending: Orthopedic Surgery

## 2020-03-06 DIAGNOSIS — M62830 Muscle spasm of back: Secondary | ICD-10-CM | POA: Diagnosis present

## 2020-03-06 DIAGNOSIS — M25561 Pain in right knee: Secondary | ICD-10-CM | POA: Insufficient documentation

## 2020-03-06 DIAGNOSIS — G8929 Other chronic pain: Secondary | ICD-10-CM | POA: Diagnosis present

## 2020-03-06 DIAGNOSIS — M25572 Pain in left ankle and joints of left foot: Secondary | ICD-10-CM | POA: Diagnosis present

## 2020-03-06 DIAGNOSIS — M25562 Pain in left knee: Secondary | ICD-10-CM | POA: Diagnosis present

## 2020-03-06 DIAGNOSIS — R293 Abnormal posture: Secondary | ICD-10-CM | POA: Diagnosis present

## 2020-03-06 DIAGNOSIS — M545 Low back pain, unspecified: Secondary | ICD-10-CM | POA: Insufficient documentation

## 2020-03-06 DIAGNOSIS — M25651 Stiffness of right hip, not elsewhere classified: Secondary | ICD-10-CM | POA: Diagnosis present

## 2020-03-06 DIAGNOSIS — M6281 Muscle weakness (generalized): Secondary | ICD-10-CM | POA: Diagnosis present

## 2020-03-06 NOTE — Patient Instructions (Signed)
Access Code: [email or web address] URL: [email or web address] Date: 03/06/2020 Prepared by: Sherlynn Stalls  Exercises Supine Bridge - 1 x daily - 7 x weekly - 3 sets - 10 reps Seated Piriformis Stretch - 1 x daily - 7 x weekly - 4 sets - 20 seconds hold Seated Hamstring Stretch - 1 x daily - 7 x weekly - 4 sets - 20 seconds hold Standing Thoracic Extension at [place] - 1 x daily - 7 x weekly - 3 sets - 10 reps-

## 2020-03-07 NOTE — Therapy (Signed)
[place]. [place], [place], [address] Phone: [phone]   Fax:  [phone]  Physical Therapy Evaluation  Patient Details  Name: Derek [name] MRN: [id number] Date of Birth: 20-Nov-1955 Referring Provider (PT): Dumonski   Encounter Date: 03/06/2020   PT End of Session - 03/06/20 1810    Visit Number 1    Number of Visits 13    Date for PT Re-Evaluation 04/17/20    PT Start Time [id number]    PT Stop Time 1700    PT Time Calculation (min) 45 min    Activity Tolerance Patient tolerated treatment well    Behavior During Therapy [facility] for tasks assessed/performed           Past Medical History:  Diagnosis Date  . Arthritis    knee, back, hip    Past Surgical History:  Procedure Laterality Date  . COLONOSCOPY  01/15/2013  . POLYPECTOMY    . thumb surg  [id number]   right thumb sx x2    There were no vitals filed for this visit.    Subjective Assessment - 03/06/20 1617    Subjective Pt has a long history of low back sore pain with no specific mechanism of injury. About 3 years ago started to experience numbness into  outter side of right foot. More recently has some right hip numbness    Pertinent History Has had 2 steroid  injections in the low back previously.    How long can you sit comfortably? no longer than 30 minutes before worsening right lumbar and hip pain    How long can you stand comfortably? unlimited    How long can you walk comfortably? unlimited    Patient Stated Goals Decrease pain, improve function    Currently in Pain? Yes    Pain Score 5     Pain Location Back    Pain Orientation Right    Pain Descriptors / Indicators Sore    Pain Type Chronic pain    Pain Radiating Towards Right posterior/lateral hip, right lateral foot.    Pain Onset More than a month ago    Pain Frequency Constant    Aggravating Factors  Prolonged sitting or driving    Pain Relieving Factors Standing up, but  numbness is always there    Effect of Pain on Daily Activities Constant sore/numb pain, limited tolerance to sitting for long periods for work, bothersome all of the time.              [facility] PT Assessment - 03/06/20 1818      Assessment   Medical Diagnosis lumbar radiculopathy    Referring Provider (PT) Dumonski    Onset Date/Surgical Date --   > 1 year ago   Hand Dominance Right    Prior Therapy Had ~1 yr chiropractic treatment 2020 with minimal relief      Balance Screen   Has the patient fallen in the past 6 months No    Has the patient had a decrease in activity level because of a fear of falling?  No    Is the patient reluctant to leave their home because of a fear of falling?  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 Stage 4 = patient exhibits a little difficulty performing usual work or household activities and hobbies.   Pt report doing usual daily tasks despite the numbness/pain.     Sensation   Light Touch Appears Intact   BLE dermatomes     Coordination   Gross Motor Movements are Fluid and Coordinated Yes       Posture/Postural Control   Posture/Postural Control Postural limitations    Postural Limitations Rounded Shoulders;Forward head;Decreased lumbar lordosis;Increased thoracic kyphosis;Posterior pelvic tilt    Posture Comments Genu varum B in standing      ROM / Strength   AROM / PROM / Strength Strength;AROM      AROM   Overall AROM  Deficits    AROM Assessment Site Lumbar    Lumbar Flexion 60%    Lumbar Extension 25%    Lumbar - Right Side Bend to lateral knee joint line    Lumbar - Left Side Bend to lateral knee joint line   + some pain along side   Lumbar - Right Rotation 50%    Lumbar - Left Rotation 50%      Strength   Overall Strength Comments B Hip flexion 4/5. B hip ext 3+/5. B Knee flex 4/5. Knee ext 3+/5 L, 4/5 R. B DF 4/5. B PF 4-/5 + pain/discomfort at left ankle.      Flexibility   Soft Tissue Assessment /Muscle Length yes    Hamstrings moderate tightness B (R>L)    Piriformis moderate tightness B (R>L)      Palpation   Palpation comment No TTP thoracolumbar spine. Gross hypomobility with joint play of thoracic and lumbar regions.      Ambulation/Gait   Stairs Yes    Stairs Assistance 7: Independent   Alternaitng pattern. pt reports pain exacerbated/increased discomfort when climbing alot of stairs or climbing up ladder for work.                     Objective measurements completed on examination: See above findings.               PT Education - 03/06/20 1800    Education Details Role of PT, PT POC, and HEP.    Person(s) Educated Patient    Methods Explanation;Demonstration;Handout    Comprehension Verbalized understanding;Returned demonstration            PT Short Term Goals - 03/06/20 1801      PT SHORT TERM GOAL #1   Title Pt will be independent with initial HEP.    Time 2    Period Weeks    Status New    Target Date 03/20/20  PT Long Term Goals - 03/06/20 1802      PT LONG TERM GOAL #1   Title Pt  will demo improved gross thoracolumbar spine mobility to at least 75%    Time 6    Period Weeks    Status New    Target Date 04/17/20      PT LONG TERM GOAL #2   Title Pt will report decreased pain with negotiating steps to </=1/10    Time 6    Period Weeks    Status New    Target Date 04/17/20      PT LONG TERM GOAL #3   Title Pt will report increased sitting tolerance > 30 minutes with numbness/pain </=1/10.    Time 6    Period Weeks    Status New    Target Date 04/18/20      PT LONG TERM GOAL #4   Title Pt will demo improved strength to at least 4+/5 for BLE.    Time 6    Period Weeks    Status New    Target Date 04/18/20                  Plan - 03/06/20 1811    Clinical Impression Statement Pt is a 65 yo male who has a long history of low back sore pain with no specific mechanism of injury. About 3 years ago started to experience numbness into lateral right foot. More recently has some right hip numbness and pain. Pt presents with trunk and LE mms tightness, muscle weakness, hypomobility of thoracolumbar spine, abnormal posture, and numbness/pain. Secondary to these impairments, he has limited sitting tolerance, experiences pain/discomfort with ADLs and daily tasks, and has limited functional mobility 2/2 pain and discomfort. Pt will benefit from skilled PT to address the aforementioned problems, decrease pain and improve function.    Personal Factors and Comorbidities Time since onset of injury/illness/exacerbation    Examination-Activity Limitations Locomotion Level;Sit;Bend;Caring for Others    Examination-Participation Restrictions Occupation;Driving    Stability/Clinical Decision Making Stable/Uncomplicated    Clinical Decision Making Low    Rehab Potential Good    PT Frequency 2x / week    PT Duration 6 weeks    PT Treatment/Interventions ADLs/Self Care Home Management;Cryotherapy;Electrical Stimulation;Moist Heat;Traction;Ultrasound;Iontophoresis 4mg /ml  Dexamethasone;Neuromuscular re-education;Balance training;Therapeutic exercise;Therapeutic activities;Functional mobility training;Stair training;Patient/family education;Manual techniques;Joint Manipulations;Passive range of motion    PT Next Visit Plan Follow up regarding carryover and tolerance to HEP. Focus of session on postural re-ed, spinal mobility, general strengthening, LE stretching    PT Home Exercise Plan Access Code: [email or web address] . Exercises  Supine Bridge - 1 x daily - 7 x weekly - 3 sets - 10 reps.  Seated Piriformis Stretch - 1 x daily - 7 x weekly - 4 sets - 20 seconds hold .Seated Hamstring Stretch - 1 x daily - 7 x weekly - 4 sets - 20 seconds hold.  Standing Thoracic Extension at [place] - 1 x daily - 7 x weekly - 3 sets - 10 reps-    Consulted and Agree with Plan of Care Patient           Patient will benefit from skilled therapeutic intervention in order to improve the following deficits and impairments:  Decreased range of motion,Decreased endurance,Pain,Improper body mechanics,Hypomobility,Impaired flexibility,Decreased mobility,Decreased strength,Postural dysfunction  Visit Diagnosis: Chronic right-sided low back pain, unspecified whether sciatica present - Plan: PT plan of care cert/re-cert  Abnormal posture - Plan: PT plan of  care cert/re-cert  Muscle weakness (generalized) - Plan: PT plan of care cert/re-cert  Muscle spasm of back - Plan: PT plan of care cert/re-cert  Chronic pain of right knee - Plan: PT plan of care cert/re-cert  Chronic pain of left knee - Plan: PT plan of care cert/re-cert  Pain in left ankle and joints of left foot - Plan: PT plan of care cert/re-cert  Stiffness of right hip, not elsewhere classified - Plan: PT plan of care cert/re-cert     Problem List Patient Active Problem List   Diagnosis Date Noted  . Left Achilles tendinitis 12/26/2017  . Degenerative arthritis of right knee 11/14/2017  . Lumbar radiculopathy  01/04/2017    Hall Busing, PT, DPT 03/07/2020, 8:37 AM  [place]. [place], [place], [address] Phone: [phone]   Fax:  [phone]  Name: Derek [name] MRN: [id number] Date of Birth: Jul 27, 1955

## 2020-03-11 ENCOUNTER — Encounter: Payer: Self-pay | Admitting: Physical Therapy

## 2020-03-11 ENCOUNTER — Other Ambulatory Visit: Payer: Self-pay

## 2020-03-11 ENCOUNTER — Ambulatory Visit: Payer: 59 | Attending: Orthopedic Surgery | Admitting: Physical Therapy

## 2020-03-11 DIAGNOSIS — R293 Abnormal posture: Secondary | ICD-10-CM | POA: Diagnosis present

## 2020-03-11 DIAGNOSIS — M6281 Muscle weakness (generalized): Secondary | ICD-10-CM | POA: Diagnosis present

## 2020-03-11 DIAGNOSIS — M25562 Pain in left knee: Secondary | ICD-10-CM | POA: Diagnosis present

## 2020-03-11 DIAGNOSIS — M545 Low back pain, unspecified: Secondary | ICD-10-CM | POA: Diagnosis present

## 2020-03-11 DIAGNOSIS — M62830 Muscle spasm of back: Secondary | ICD-10-CM | POA: Diagnosis present

## 2020-03-11 DIAGNOSIS — G8929 Other chronic pain: Secondary | ICD-10-CM | POA: Diagnosis present

## 2020-03-11 NOTE — Therapy (Signed)
[place]. [place], [place], [address] Phone: [phone]   Fax:  [phone]  Physical Therapy Treatment  Patient Details  Name: Derek [name] MRN: [id number] Date of Birth: 05-Jan-1956 Referring Provider (PT): Dumonski   Encounter Date: 03/11/2020   PT End of Session - 03/11/20 1501    Visit Number 2    Number of Visits 13    Date for PT Re-Evaluation 04/17/20    PT Start Time 4174    PT Stop Time 1501    PT Time Calculation (min) 41 min    Activity Tolerance Patient tolerated treatment well    Behavior During Therapy [facility] for tasks assessed/performed           Past Medical History:  Diagnosis Date  . Arthritis    knee, back, hip    Past Surgical History:  Procedure Laterality Date  . COLONOSCOPY  01/15/2013  . POLYPECTOMY    . thumb surg  [id number]   right thumb sx x2    There were no vitals filed for this visit.   Subjective Assessment - 03/11/20 1426    Subjective No changes since evaluation    Currently in Pain? No/denies                             [facility] Adult PT Treatment/Exercise - 03/11/20 0001      Exercises   Exercises Lumbar      Lumbar Exercises: Aerobic   Nustep L5 x 6 min      Lumbar Exercises: Machines for Strengthening   Other Lumbar Machine Exercise Rows & Lats 25lb 2x10      Lumbar Exercises: Standing   Shoulder Extension 20 reps;Both;Strengthening;Power Tower    Shoulder Extension Limitations 10      Lumbar Exercises: Seated   Sit to Stand 5 reps   Some R knee pain     Lumbar Exercises: Supine   Ab Set 2 seconds;20 reps    Other Supine Lumbar Exercises LE on Pball bridges, K2C, Oblq x10      Lumbar Exercises: Prone   Other Prone Lumbar Exercises prone press ups                    PT Short Term Goals - 03/11/20 1502      PT SHORT TERM GOAL #1   Title Pt will be independent with initial HEP.    Status Partially Met              PT Long Term Goals - 03/06/20 1802      PT LONG TERM GOAL #1   Title Pt will demo improved gross thoracolumbar spine mobility to at least 75%    Time 6    Period Weeks    Status New    Target Date 04/17/20      PT LONG TERM GOAL #2   Title Pt will report decreased pain with negotiating steps to </=1/10    Time 6    Period Weeks    Status New    Target Date 04/17/20      PT LONG TERM GOAL #3   Title Pt will report increased sitting tolerance > 30 minutes with numbness/pain </=1/10.    Time 6    Period Weeks    Status New    Target Date 04/18/20      PT LONG TERM GOAL #4   Title  Pt will demo improved strength to at least 4+/5 for BLE.    Time 6    Period Weeks    Status New    Target Date 04/18/20                 Plan - 03/11/20 1502    Clinical Impression Statement Pt reports no change since evaluation. Reports some uncertainty with one if his HEP interventions, went over HEP for better at home compliance. Initiated postural and core strength. Cues needed throughout session for core engagement. Pt has forward flexed posture with rounded shoulders. Tactile cues to keep shoulder back throughout session.    Personal Factors and Comorbidities Time since onset of injury/illness/exacerbation    Examination-Activity Limitations Locomotion Level;Sit;Bend;Caring for Others    Examination-Participation Restrictions Occupation;Driving    Stability/Clinical Decision Making Stable/Uncomplicated    Rehab Potential Good    PT Frequency 2x / week    PT Duration 6 weeks    PT Treatment/Interventions ADLs/Self Care Home Management;Cryotherapy;Electrical Stimulation;Moist Heat;Traction;Ultrasound;Iontophoresis 59m/ml Dexamethasone;Neuromuscular re-education;Balance training;Therapeutic exercise;Therapeutic activities;Functional mobility training;Stair training;Patient/family education;Manual techniques;Joint Manipulations;Passive range of motion    PT Next Visit Plan Focus of  session on postural re-ed, spinal mobility, general strengthening, LE stretching           Patient will benefit from skilled therapeutic intervention in order to improve the following deficits and impairments:  Decreased range of motion,Decreased endurance,Pain,Improper body mechanics,Hypomobility,Impaired flexibility,Decreased mobility,Decreased strength,Postural dysfunction  Visit Diagnosis: Abnormal posture  Chronic right-sided low back pain, unspecified whether sciatica present  Muscle weakness (generalized)     Problem List Patient Active Problem List   Diagnosis Date Noted  . Left Achilles tendinitis 12/26/2017  . Degenerative arthritis of right knee 11/14/2017  . Lumbar radiculopathy 01/04/2017    RScot Jun2/02/2020, 3:06 PM  [place] [address]Phone: [phone]  Fax:  [phone] Name: Derek CarlisiMRN: [id number] of Birth: [birth year]

## 2020-03-13 ENCOUNTER — Encounter: Payer: Self-pay | Admitting: Physical Therapy

## 2020-03-13 ENCOUNTER — Ambulatory Visit: Payer: 59 | Admitting: Physical Therapy

## 2020-03-13 ENCOUNTER — Other Ambulatory Visit: Payer: Self-pay

## 2020-03-13 DIAGNOSIS — M545 Low back pain, unspecified: Secondary | ICD-10-CM

## 2020-03-13 DIAGNOSIS — R293 Abnormal posture: Secondary | ICD-10-CM

## 2020-03-13 DIAGNOSIS — M6281 Muscle weakness (generalized): Secondary | ICD-10-CM

## 2020-03-13 NOTE — Therapy (Signed)
[place]. [place], [place], [address] Phone: [phone]   Fax:  [phone]  Physical Therapy Treatment  Patient Details  Name: Derek [name] MRN: [id number] Date of Birth: 05-Jan-1956 Referring Provider (PT): Dumonski   Encounter Date: 03/13/2020   PT End of Session - 03/13/20 0347    Visit Number 3    Number of Visits 13    Date for PT Re-Evaluation 04/17/20    PT Start Time 4259    PT Stop Time 1646    PT Time Calculation (min) 48 min    Activity Tolerance Patient tolerated treatment well    Behavior During Therapy [facility] for tasks assessed/performed           Past Medical History:  Diagnosis Date  . Arthritis    knee, back, hip    Past Surgical History:  Procedure Laterality Date  . COLONOSCOPY  01/15/2013  . POLYPECTOMY    . thumb surg  [id number]   right thumb sx x2    There were no vitals filed for this visit.   Subjective Assessment - 03/13/20 1556    Subjective Doing ok today, went grocery shopping after last session.    Currently in Pain? Yes    Pain Score 5     Pain Location Back    Pain Orientation Right                             [facility] Adult PT Treatment/Exercise - 03/13/20 0001      Lumbar Exercises: Stretches   Passive Hamstring Stretch Left;Right;4 reps;10 seconds    Single Knee to Chest Stretch Left;Right;3 reps;20 seconds;10 seconds    Lower Trunk Rotation 3 reps;10 seconds;20 seconds      Lumbar Exercises: Aerobic   Recumbent Bike L2.1 x 4 min    Nustep L5 x 6 min      Lumbar Exercises: Machines for Strengthening   Cybex Knee Extension 5lb 2x10    Cybex Knee Flexion 25lb 2x10    Leg Press 40lb 2x12    Other Lumbar Machine Exercise Rows & Lats 25lb 2x12      Lumbar Exercises: Standing   Other Standing Lumbar Exercises Overhead Ext yellow ball 2x10, 5ib hip Ext & Abd x 10 each    Other Standing Lumbar Exercises 20lb AR press x10 each      Lumbar  Exercises: Seated   Sit to Stand 10 reps   x2 OHP yellow ball     Lumbar Exercises: Supine   Other Supine Lumbar Exercises LE on Pball bridges, K2C, Oblq x10                    PT Short Term Goals - 03/13/20 1653      PT SHORT TERM GOAL #1   Title Pt will be independent with initial HEP.    Status Achieved             PT Long Term Goals - 03/06/20 1802      PT LONG TERM GOAL #1   Title Pt will demo improved gross thoracolumbar spine mobility to at least 75%    Time 6    Period Weeks    Status New    Target Date 04/17/20      PT LONG TERM GOAL #2   Title Pt will report decreased pain with negotiating steps to </=1/10    Time 6  Period Weeks    Status New    Target Date 04/17/20      PT LONG TERM GOAL #3   Title Pt will report increased sitting tolerance > 30 minutes with numbness/pain </=1/10.    Time 6    Period Weeks    Status New    Target Date 04/18/20      PT LONG TERM GOAL #4   Title Pt will demo improved strength to at least 4+/5 for BLE.    Time 6    Period Weeks    Status New    Target Date 04/18/20                 Plan - 03/13/20 1647    Clinical Impression Statement Pt did well with progressed treatment session. Some core weakness present with anti rotational presses. Some L medial knee discomfort with hamstring curls. Postural cues needed standing hip extension. Some fatigue with sit to stands. Positive response to lower trunk rotations.    Personal Factors and Comorbidities Time since onset of injury/illness/exacerbation    Examination-Activity Limitations Locomotion Level;Sit;Bend;Caring for Others    Examination-Participation Restrictions Occupation;Driving    Stability/Clinical Decision Making Stable/Uncomplicated    Rehab Potential Good    PT Frequency 2x / week    PT Duration 6 weeks    PT Treatment/Interventions ADLs/Self Care Home Management;Cryotherapy;Electrical Stimulation;Moist Heat;Traction;Ultrasound;Iontophoresis  4mg /ml Dexamethasone;Neuromuscular re-education;Balance training;Therapeutic exercise;Therapeutic activities;Functional mobility training;Stair training;Patient/family education;Manual techniques;Joint Manipulations;Passive range of motion    PT Next Visit Plan Focus of session on postural re-ed, spinal mobility, general strengthening, LE stretching           Patient will benefit from skilled therapeutic intervention in order to improve the following deficits and impairments:  Decreased range of motion,Decreased endurance,Pain,Improper body mechanics,Hypomobility,Impaired flexibility,Decreased mobility,Decreased strength,Postural dysfunction  Visit Diagnosis: Chronic right-sided low back pain, unspecified whether sciatica present  Muscle weakness (generalized)  Abnormal posture     Problem List Patient Active Problem List   Diagnosis Date Noted  . Left Achilles tendinitis 12/26/2017  . Degenerative arthritis of right knee 11/14/2017  . Lumbar radiculopathy 01/04/2017    Scot Jun, PTA 03/13/2020, 4:54 PM  [place]. [place], [place], [address] Phone: [phone]   Fax:  [phone]  Name: Hayato Guaman MRN: [id number] Date of Birth: [birth year]

## 2020-03-18 ENCOUNTER — Encounter: Payer: Self-pay | Admitting: Physical Therapy

## 2020-03-18 ENCOUNTER — Ambulatory Visit: Payer: 59 | Admitting: Physical Therapy

## 2020-03-18 ENCOUNTER — Other Ambulatory Visit: Payer: Self-pay

## 2020-03-18 DIAGNOSIS — M545 Low back pain, unspecified: Secondary | ICD-10-CM

## 2020-03-18 DIAGNOSIS — G8929 Other chronic pain: Secondary | ICD-10-CM

## 2020-03-18 DIAGNOSIS — R293 Abnormal posture: Secondary | ICD-10-CM

## 2020-03-18 DIAGNOSIS — M62830 Muscle spasm of back: Secondary | ICD-10-CM

## 2020-03-18 DIAGNOSIS — M6281 Muscle weakness (generalized): Secondary | ICD-10-CM

## 2020-03-18 DIAGNOSIS — M25562 Pain in left knee: Secondary | ICD-10-CM

## 2020-03-18 NOTE — Therapy (Signed)
[place]. [place], [place], [address] Phone: [phone]   Fax:  [phone]  Physical Therapy Treatment  Patient Details  Name: Derek [name] MRN: [id number] Date of Birth: Nov 01, 1955 Referring Provider (PT): Dumonski   Encounter Date: 03/18/2020    Past Medical History:  Diagnosis Date  . Arthritis    knee, back, hip    Past Surgical History:  Procedure Laterality Date  . COLONOSCOPY  01/15/2013  . POLYPECTOMY    . thumb surg  [id number]   right thumb sx x2    There were no vitals filed for this visit.   Subjective Assessment - 03/18/20 1558    Subjective Doing ok but the L knee is hurting. Too much ladder work yesterday    Currently in Pain? Yes    Pain Score 3     Pain Location Knee    Pain Orientation Left                             [facility] Adult PT Treatment/Exercise - 03/18/20 0001      Lumbar Exercises: Stretches   Passive Hamstring Stretch Left;Right;4 reps;10 seconds    Single Knee to Chest Stretch Left;Right;3 reps;20 seconds;10 seconds    Lower Trunk Rotation 3 reps;10 seconds;20 seconds    ITB Stretch Left;Right;3 reps;20 seconds;10 seconds      Lumbar Exercises: Aerobic   Recumbent Bike L4 x 4 min    Nustep L5 x 6 min      Lumbar Exercises: Machines for Strengthening   Other Lumbar Machine Exercise Rows & Lats 35lb 2x12      Lumbar Exercises: Standing   Shoulder Extension 20 reps;Both;Strengthening;Power Tower    Shoulder Extension Limitations 10    Other Standing Lumbar Exercises 20lb AR press x10 each      Lumbar Exercises: Seated   Sit to Stand 20 reps   OHP yellow ball     Lumbar Exercises: Supine   Other Supine Lumbar Exercises LE on Pball bridges, K2C, Oblq x10                    PT Short Term Goals - 03/13/20 1653      PT SHORT TERM GOAL #1   Title Pt will be independent with initial HEP.    Status Achieved             PT Long  Term Goals - 03/18/20 1604      PT LONG TERM GOAL #2   Status On-going      PT LONG TERM GOAL #3   Title Pt will report increased sitting tolerance > 30 minutes with numbness/pain </=1/10.    Status Partially Met                 Plan - 03/18/20 1644    Clinical Impression Statement Slight progression towards goals. he report the numbness in the lateral R shin she is biggest complaint. He still has pain with stair negotiation. Mor focus on stretching today. HS flexibility was good but ITB was tight bilaterally. Tactile cues for core engagement needed with ab sets. increase resistance tolerated with seated rows and lats. He continues to demo core weakness with anti rotational presses.    Personal Factors and Comorbidities Time since onset of injury/illness/exacerbation    Examination-Activity Limitations Locomotion Level;Sit;Bend;Caring for Others    Examination-Participation Restrictions Occupation;Driving    Rehab Potential Good  PT Frequency 2x / week    PT Duration 6 weeks    PT Treatment/Interventions ADLs/Self Care Home Management;Cryotherapy;Electrical Stimulation;Moist Heat;Traction;Ultrasound;Iontophoresis 88m/ml Dexamethasone;Neuromuscular re-education;Balance training;Therapeutic exercise;Therapeutic activities;Functional mobility training;Stair training;Patient/family education;Manual techniques;Joint Manipulations;Passive range of motion    PT Next Visit Plan Focus of session on postural re-ed, spinal mobility, general strengthening, LE stretching           Patient will benefit from skilled therapeutic intervention in order to improve the following deficits and impairments:  Decreased range of motion,Decreased endurance,Pain,Improper body mechanics,Hypomobility,Impaired flexibility,Decreased mobility,Decreased strength,Postural dysfunction  Visit Diagnosis: Chronic right-sided low back pain, unspecified whether sciatica present  Muscle weakness  (generalized)  Abnormal posture  Muscle spasm of back  Chronic pain of left knee     Problem List Patient Active Problem List   Diagnosis Date Noted  . Left Achilles tendinitis 12/26/2017  . Degenerative arthritis of right knee 11/14/2017  . Lumbar radiculopathy 01/04/2017    RScot Jun2/09/2020, 4:48 PM  [place] [address]Phone: [phone]  Fax:  [phone] Name: Derek TabarMRN: [id number] of Birth: 106/06/1955

## 2020-03-20 ENCOUNTER — Other Ambulatory Visit: Payer: Self-pay

## 2020-03-20 ENCOUNTER — Encounter: Payer: Self-pay | Admitting: Physical Therapy

## 2020-03-20 ENCOUNTER — Ambulatory Visit: Payer: 59 | Admitting: Physical Therapy

## 2020-03-20 DIAGNOSIS — M545 Low back pain, unspecified: Secondary | ICD-10-CM

## 2020-03-20 DIAGNOSIS — M6281 Muscle weakness (generalized): Secondary | ICD-10-CM

## 2020-03-20 DIAGNOSIS — R293 Abnormal posture: Secondary | ICD-10-CM | POA: Diagnosis not present

## 2020-03-20 DIAGNOSIS — G8929 Other chronic pain: Secondary | ICD-10-CM

## 2020-03-20 DIAGNOSIS — M25562 Pain in left knee: Secondary | ICD-10-CM

## 2020-03-20 DIAGNOSIS — M62830 Muscle spasm of back: Secondary | ICD-10-CM

## 2020-03-20 NOTE — Therapy (Signed)
**Note Derek-Identified via Obfuscation** [place]. [place], [place], [address] Phone: [phone]   Fax:  [phone]  Physical Therapy Treatment  Patient Details  Name: Derek [name] MRN: [id number] Date of Birth: September 18, 1955 Referring Provider (PT): Dumonski   Encounter Date: 03/20/2020   PT End of Session - 03/20/20 1643    Visit Number 4    Number of Visits 13    Date for PT Re-Evaluation 04/17/20    PT Start Time 1740    PT Stop Time 1645    PT Time Calculation (min) 40 min    Activity Tolerance Patient tolerated treatment well    Behavior During Therapy [facility] for tasks assessed/performed           Past Medical History:  Diagnosis Date  . Arthritis    knee, back, hip    Past Surgical History:  Procedure Laterality Date  . COLONOSCOPY  01/15/2013  . POLYPECTOMY    . thumb surg  [id number]   right thumb sx x2    There were no vitals filed for this visit.   Subjective Assessment - 03/20/20 1605    Subjective Some pain in the L knee, numbness in R foot.    Currently in Pain? Yes    Pain Score 5     Pain Location Knee    Pain Orientation Left    Pain Descriptors / Indicators Sore              [facility] PT Assessment - 03/20/20 0001      AROM   Overall AROM  Within functional limits for tasks performed                         [facility] Adult PT Treatment/Exercise - 03/20/20 0001      Lumbar Exercises: Stretches   Single Knee to Chest Stretch Left;Right;3 reps;20 seconds;10 seconds    Lower Trunk Rotation 3 reps;10 seconds;20 seconds    Piriformis Stretch Left;Right;3 reps;10 seconds;20 seconds      Lumbar Exercises: Aerobic   UBE (Upper Arm Bike) L2 x 2 min each    Recumbent Bike L2.2  x5 min      Lumbar Exercises: Machines for Strengthening   Leg Press 40lb 2x15    Other Lumbar Machine Exercise Hip Ext 5lb x 10 each      Lumbar Exercises: Standing   Shoulder Extension 20 reps;Both;Strengthening;Power Tower     Shoulder Extension Limitations 15    Other Standing Lumbar Exercises Overhead Ext yellow ball x10, Cross body x 10 each    Other Standing Lumbar Exercises 20lb AR press x10 each      Lumbar Exercises: Supine   Bridge Compliant;15 reps    Other Supine Lumbar Exercises Bilat SLR 2x10      Lumbar Exercises: Prone   Other Prone Lumbar Exercises Planks 15 sec x4                    PT Short Term Goals - 03/13/20 1653      PT SHORT TERM GOAL #1   Title Pt will be independent with initial HEP.    Status Achieved             PT Long Term Goals - 03/20/20 1646      PT LONG TERM GOAL #1   Title Pt will demo improved gross thoracolumbar spine mobility to at least 75%    Status Achieved  PT LONG TERM GOAL #2   Title Pt will report decreased pain with negotiating steps to </=1/10    Status On-going                 Plan - 03/20/20 1647    Clinical Impression Statement Pt has progressed increasing his lumbar ROM meeting goal. Some stiffness noted with cross body weighted ball twist. Pt able to complete hip ext with tactile cues to shoulder and lower back to prevent postural compensations. Attempted bridge and marches but pt reports cramping sensation in L HS. Visible shaking noted with prone planks.    Personal Factors and Comorbidities Time since onset of injury/illness/exacerbation    Examination-Activity Limitations Locomotion Level;Sit;Bend;Caring for Others    Examination-Participation Restrictions Occupation;Driving    Stability/Clinical Decision Making Stable/Uncomplicated    Clinical Decision Making Low    Rehab Potential Good    PT Frequency 2x / week    PT Duration 6 weeks    PT Treatment/Interventions ADLs/Self Care Home Management;Cryotherapy;Electrical Stimulation;Moist Heat;Traction;Ultrasound;Iontophoresis 4mg /ml Dexamethasone;Neuromuscular re-education;Balance training;Therapeutic exercise;Therapeutic activities;Functional mobility training;Stair  training;Patient/family education;Manual techniques;Joint Manipulations;Passive range of motion           Patient will benefit from skilled therapeutic intervention in order to improve the following deficits and impairments:  Decreased range of motion,Decreased endurance,Pain,Improper body mechanics,Hypomobility,Impaired flexibility,Decreased mobility,Decreased strength,Postural dysfunction  Visit Diagnosis: Chronic right-sided low back pain, unspecified whether sciatica present  Muscle weakness (generalized)  Abnormal posture  Muscle spasm of back  Chronic pain of left knee     Problem List Patient Active Problem List   Diagnosis Date Noted  . Left Achilles tendinitis 12/26/2017  . Degenerative arthritis of right knee 11/14/2017  . Lumbar radiculopathy 01/04/2017    Scot Jun, PTA 03/20/2020, 4:58 PM  [place]. [place], [place], [address] Phone: [phone]   Fax:  [phone]  Name: Derek [name] MRN: [id number] Date of Birth: January 06, 1956

## 2020-03-27 ENCOUNTER — Ambulatory Visit: Payer: 59 | Admitting: Physical Therapy

## 2022-02-09 ENCOUNTER — Ambulatory Visit: Payer: 59 | Admitting: Gastroenterology

## 2022-02-09 ENCOUNTER — Encounter: Payer: Self-pay | Admitting: Gastroenterology

## 2022-02-09 VITALS — BP 142/78 | HR 62 | Ht 71.0 in | Wt 212.0 lb

## 2022-02-09 DIAGNOSIS — R194 Change in bowel habit: Secondary | ICD-10-CM | POA: Diagnosis not present

## 2022-02-09 DIAGNOSIS — Z86010 Personal history of colonic polyps: Secondary | ICD-10-CM | POA: Diagnosis not present

## 2022-02-09 MED ORDER — NA SULFATE-K SULFATE-MG SULF 17.5-3.13-1.6 GM/177ML PO SOLN: 1.0000 | Freq: Once | ORAL | 0 refills | Status: AC

## 2022-02-09 NOTE — Patient Instructions (Signed)
_______________________________________________________  If you are age 67 or older, your body mass index should be between 23-30. Your Body mass index is 29.57 kg/m. If this is out of the aforementioned range listed, please consider follow up with your Primary Care Provider.  If you are age 70 or younger, your body mass index should be between 19-25. Your Body mass index is 29.57 kg/m. If this is out of the aformentioned range listed, please consider follow up with your Primary Care Provider.   ________________________________________________________  The [facility] providers would like to encourage you to use [facility] to communicate with providers for non-urgent requests or questions.  Due to long hold times on the telephone, sending your provider a message by [facility] may be a faster and more efficient way to get a response.  Please allow 48 business hours for a response.  Please remember that this is for non-urgent requests.  _______________________________________________________  Dennis Bast have been scheduled for a colonoscopy. Please follow written instructions given to you at your visit today.  Please pick up your prep supplies at the pharmacy within the next 1-3 days. If you use inhalers (even only as needed), please bring them with you on the day of your procedure.

## 2022-02-09 NOTE — Progress Notes (Signed)
Assessment    Personal history of sessile serrated and adenomatous colon polyps Variable bowel habits, chronic pattern.  Suspected lactose intolerance and potentially other food and beverage intolerances  Recommendations   Schedule colonoscopy for polyp surveillance. The risks (including bleeding, perforation, infection, missed lesions, medication reactions and possible hospitalization or surgery if complications occur), benefits, and alternatives to colonoscopy with possible biopsy and possible polypectomy were discussed with the patient and they consent to proceed.   Food and beverage diary to further assess bowel habit variation.   HPI   Chief complaint: Personal history of colon polyps, variable bowel habits  Patient profile:  Derek [name] is a 67 y.o. male referred by Daisy Lazar, PA-C for variable bowel habits and a personal history of colon polyps.  He relates a long history of variable bowel habits with sometimes normal stools, sometimes soft stools and sometimes loose or urgent stools.  He notes urgent loose stools generally following lactose products which she typically avoids.  He has not noticed any particular pattern of food or beverages leading to other symptoms.  This pattern has not changed for many years.  He has a history of 3 sessile serrated polyps on his most recent colonoscopy and adenomatous polyps on prior colonoscopies.  No other gastrointestinal complaints. Denies weight loss, abdominal pain, constipation, change in stool caliber, melena, hematochezia, nausea, vomiting, dysphagia, reflux symptoms, chest pain.   Previous Labs / Imaging::    Latest Ref Rng & Units 12/10/2008    3:33 PM  CBC  WBC 4.0 - 10.5 K/uL 6.9   Hemoglobin 13.0 - 17.0 g/dL 14.9   Hematocrit 39.0 - 52.0 % 44.5   Platelets 150 - 400 K/uL 248     No results found for: "LIPASE"    Latest Ref Rng & Units 12/10/2008    3:33 PM  CMP  Glucose 70 - 99 mg/dL 101   BUN 6 - 23 mg/dL 15    Creatinine 0.4 - 1.5 mg/dL 1.0   Sodium 135 - 145 mEq/L 139   Potassium 3.5 - 5.1 mEq/L 4.0   Chloride 96 - 112 mEq/L 106   CO2 19 - 32 mEq/L 25   Calcium 8.4 - 10.5 mg/dL 8.6      Previous GI evaluation    Endoscopies:  Colonoscopy Dec 2020 Three 6 to 8 mm polyps in the sigmoid colon and in the cecum, removed with a cold snare. Resected and retrieved. (3 SSPs) - Internal hemorrhoids. - Otherwise without abnormalities on direct and retroflexion views.  Imaging:     Past Medical History:  Diagnosis Date   Arthritis    knee, back, hip   Past Surgical History:  Procedure Laterality Date   COLONOSCOPY  01/15/2013   POLYPECTOMY     thumb surg  [id number]   right thumb sx x2   Family History  Problem Relation Age of Onset   Transient ischemic attack Mother    Colon cancer Neg Hx    Esophageal cancer Neg Hx    Rectal cancer Neg Hx    Stomach cancer Neg Hx    Colon polyps Neg Hx    Social History   Tobacco Use   Smoking status: Never   Smokeless tobacco: Never  Vaping Use   Vaping Use: Never used  Substance Use Topics   Alcohol use: Yes    Alcohol/week: 20.0 standard drinks of alcohol    Types: 20 Cans of beer per week   Drug use: No  Current Outpatient Medications  Medication Sig Dispense Refill   atorvastatin (LIPITOR) 10 MG tablet Take 10 mg by mouth 3 (three) times a week.     meloxicam (MOBIC) 15 MG tablet Take 15 mg by mouth daily. (Patient not taking: Reported on 03/06/2020)     No current facility-administered medications for this visit.   No Known Allergies  Review of Systems: All other systems reviewed and negative except where noted in HPI.    Physical Exam    Wt Readings from Last 3 Encounters:  02/09/22 212 lb (96.2 kg)  01/16/19 205 lb (93 kg)  01/09/19 205 lb (93 kg)    BP (!) 142/78   Pulse 62   Ht '5\' 11"'$  (1.803 m)   Wt 212 lb (96.2 kg)   SpO2 98%   BMI 29.57 kg/m  Constitutional:  Generally well appearing male in no acute  distress. HEENT: Pupils normal.  Conjunctivae are normal. No scleral icterus. No oral lesions or deformities noted.  Neck: Supple.  Cardiac: Normal rate, regular rhythm without murmurs. Pulmonary/chest: Effort normal and breath sounds normal. No wheezing, rales or rhonchi. Abdominal: Soft, nondistended, nontender. Active bowel sounds. No palpable HSM, masses or hernias. Rectal: Deferred to colonoscopy Extremities: No edema or deformities noted Neurological: Alert and oriented to person, place and time. Psychiatric: Pleasant. Normal mood and affect. Behavior is normal. Skin: Skin is warm and dry. No rashes noted.  Lucio Edward, MD   cc:  Referring Provider Loyola Mast, PA-C

## 2022-03-10 ENCOUNTER — Encounter: Payer: Self-pay | Admitting: Gastroenterology

## 2022-03-17 ENCOUNTER — Ambulatory Visit (AMBULATORY_SURGERY_CENTER): Payer: 59 | Admitting: Gastroenterology

## 2022-03-17 ENCOUNTER — Encounter: Payer: Self-pay | Admitting: Gastroenterology

## 2022-03-17 VITALS — BP 151/85 | HR 58 | Temp 97.8°F | Resp 14 | Ht 71.0 in | Wt 212.0 lb

## 2022-03-17 DIAGNOSIS — K635 Polyp of colon: Secondary | ICD-10-CM

## 2022-03-17 DIAGNOSIS — Z86010 Personal history of colonic polyps: Secondary | ICD-10-CM | POA: Diagnosis not present

## 2022-03-17 DIAGNOSIS — D123 Benign neoplasm of transverse colon: Secondary | ICD-10-CM

## 2022-03-17 DIAGNOSIS — Z09 Encounter for follow-up examination after completed treatment for conditions other than malignant neoplasm: Secondary | ICD-10-CM | POA: Diagnosis present

## 2022-03-17 DIAGNOSIS — D120 Benign neoplasm of cecum: Secondary | ICD-10-CM

## 2022-03-17 MED ORDER — SODIUM CHLORIDE 0.9 % IV SOLN: 500.0000 mL | Freq: Once | INTRAVENOUS | Status: DC

## 2022-03-17 NOTE — Op Note (Signed)
[facility] Patient Name: Derek [name] Procedure Date: 03/17/2022 8:53 AM MRN: [id number] Endoscopist: Ladene Artist , MD, [id number] Age: 67 Referring MD:  Date of Birth: March 29, 1955 Gender: Male Account #: [id number] Procedure:                Colonoscopy Indications:              Surveillance: Personal history of sessile serrated                            and adenomatous polyps on last colonoscopy 3 years                            ago Medicines:                Monitored Anesthesia Care Procedure:                Pre-Anesthesia Assessment:                           - Prior to the procedure, a History and Physical                            was performed, and patient medications and                            allergies were reviewed. The patient's tolerance of                            previous anesthesia was also reviewed. The risks                            and benefits of the procedure and the sedation                            options and risks were discussed with the patient.                            All questions were answered, and informed consent                            was obtained. Prior Anticoagulants: The patient has                            taken no anticoagulant or antiplatelet agents. ASA                            Grade Assessment: II - A patient with mild systemic                            disease. After reviewing the risks and benefits,                            the patient was deemed in satisfactory condition to  undergo the procedure.                           After obtaining informed consent, the colonoscope                            was passed under direct vision. Throughout the                            procedure, the patient's blood pressure, pulse, and                            oxygen saturations were monitored continuously. The                            Olympus CF-HQ190L ([id number]) Colonoscope was                             introduced through the anus and advanced to the the                            cecum, identified by appendiceal orifice and                            ileocecal valve. The ileocecal valve, appendiceal                            orifice, and rectum were photographed. The quality                            of the bowel preparation was good. The colonoscopy                            was performed without difficulty. The patient                            tolerated the procedure well. IC valve photo did                            not capture. Scope In: 9:07:10 AM Scope Out: 9:20:56 AM Scope Withdrawal Time: 0 hours 12 minutes 4 seconds  Total Procedure Duration: 0 hours 13 minutes 46 seconds  Findings:                 The perianal and digital rectal examinations were                            normal.                           Two sessile polyps were found in the transverse                            colon and cecum. The polyps were 6 to 7 mm in size.  These polyps were removed with a cold snare.                            Resection and retrieval were complete.                           Internal hemorrhoids were found during                            retroflexion. The hemorrhoids were small and Grade                            I (internal hemorrhoids that do not prolapse).                           The exam was otherwise without abnormality on                            direct and retroflexion views. Complications:            No immediate complications. Estimated blood loss:                            None. Estimated Blood Loss:     Estimated blood loss: none. Impression:               - Two 6 to 7 mm polyps in the transverse colon and                            in the cecum, removed with a cold snare. Resected                            and retrieved.                           - Internal hemorrhoids.                           - The examination  was otherwise normal on direct                            and retroflexion views. Recommendation:           - Repeat colonoscopy date to be determined after                            pending pathology results are reviewed for                            surveillance based on pathology results.                           - Patient has a contact number available for                            emergencies. The signs and symptoms of potential  delayed complications were discussed with the                            patient. Return to normal activities tomorrow.                            Written discharge instructions were provided to the                            patient.                           - Resume previous diet.                           - Continue present medications.                           - Await pathology results. Ladene Artist, MD 03/17/2022 9:27:57 AM This report has been signed electronically.

## 2022-03-17 NOTE — Progress Notes (Signed)
Called to room to assist during endoscopic procedure.  Patient ID and intended procedure confirmed with present staff. Received instructions for my participation in the procedure from the performing physician.  

## 2022-03-17 NOTE — Patient Instructions (Signed)
Information on polyps and hemorrhoids given to you today.  Await pathology results.  Resume previous diet and medications.    YOU HAD AN ENDOSCOPIC PROCEDURE TODAY AT THE [facility]:   Refer to the procedure report that was given to you for any specific questions about what was found during the examination.  If the procedure report does not answer your questions, please call your gastroenterologist to clarify.  If you requested that your care partner not be given the details of your procedure findings, then the procedure report has been included in a sealed envelope for you to review at your convenience later.  YOU SHOULD EXPECT: Some feelings of bloating in the abdomen. Passage of more gas than usual.  Walking can help get rid of the air that was put into your GI tract during the procedure and reduce the bloating. If you had a lower endoscopy (such as a colonoscopy or flexible sigmoidoscopy) you may notice spotting of blood in your stool or on the toilet paper. If you underwent a bowel prep for your procedure, you may not have a normal bowel movement for a few days.  Please Note:  You might notice some irritation and congestion in your nose or some drainage.  This is from the oxygen used during your procedure.  There is no need for concern and it should clear up in a day or so.  SYMPTOMS TO REPORT IMMEDIATELY:  Following lower endoscopy (colonoscopy or flexible sigmoidoscopy):  Excessive amounts of blood in the stool  Significant tenderness or worsening of abdominal pains  Swelling of the abdomen that is new, acute  Fever of 100F or higher   For urgent or emergent issues, a gastroenterologist can be reached at any hour by calling [phone]. Do not use MyChart messaging for urgent concerns.    DIET:  We do recommend a small meal at first, but then you may proceed to your regular diet.  Drink plenty of fluids but you should avoid alcoholic beverages for 24  hours.  ACTIVITY:  You should plan to take it easy for the rest of today and you should NOT DRIVE or use heavy machinery until tomorrow (because of the sedation medicines used during the test).    FOLLOW UP: Our staff will call the number listed on your records the next business day following your procedure.  We will call around 7:15- 8:00 am to check on you and address any questions or concerns that you may have regarding the information given to you following your procedure. If we do not reach you, we will leave a message.     If any biopsies were taken you will be contacted by phone or by letter within the next 1-3 weeks.  Please call us at [phone] if you have not heard about the biopsies in 3 weeks.    SIGNATURES/CONFIDENTIALITY: You and/or your care partner have signed paperwork which will be entered into your electronic medical record.  These signatures attest to the fact that that the information above on your After Visit Summary has been reviewed and is understood.  Full responsibility of the confidentiality of this discharge information lies with you and/or your care-partner. 

## 2022-03-17 NOTE — Progress Notes (Signed)
Pt's states no medical or surgical changes since previsit or office visit. 

## 2022-03-17 NOTE — Progress Notes (Signed)
Sedate, gd SR, tolerated procedure well, VSS, report to RN 

## 2022-03-17 NOTE — Progress Notes (Signed)
History & Physical  Primary Care Physician:  Loyola Mast, PA-C Primary Gastroenterologist: Lucio Edward, MD  Impression / Plan: Personal history of sessile serrated and adenomatous colon polyps for colonoscopy.  CHIEF COMPLAINT:  Personal history of colon polyps   HPI: Derek [name] is a 67 y.o. male with a personal history of sessile serrated and adenomatous colon polyps for colonoscopy.    Past Medical History:  Diagnosis Date   Arthritis    knee, back, hip    Past Surgical History:  Procedure Laterality Date   COLONOSCOPY  01/15/2013   POLYPECTOMY     thumb surg  5053,9767   right thumb sx x2    Prior to Admission medications   Medication Sig Start Date End Date Taking? Authorizing Provider  Coenzyme Q10 100 MG capsule    Yes [provider]  atorvastatin (LIPITOR) 10 MG tablet Take 10 mg by mouth 3 (three) times a week.    [provider]  meloxicam (MOBIC) 15 MG tablet Take 15 mg by mouth daily. Patient not taking: Reported on 03/06/2020 12/31/18   [provider]    Current Outpatient Medications  Medication Sig Dispense Refill   Coenzyme Q10 100 MG capsule      atorvastatin (LIPITOR) 10 MG tablet Take 10 mg by mouth 3 (three) times a week.     meloxicam (MOBIC) 15 MG tablet Take 15 mg by mouth daily. (Patient not taking: Reported on 03/06/2020)     Current Facility-Administered Medications  Medication Dose Route Frequency Provider Last Rate Last Admin   0.9 %  sodium chloride infusion  500 mL Intravenous Once Ladene Artist, MD        Allergies as of 03/17/2022   (No Known Allergies)    Family History  Problem Relation Age of Onset   Transient ischemic attack Mother    Colon cancer Neg Hx    Esophageal cancer Neg Hx    Rectal cancer Neg Hx    Stomach cancer Neg Hx    Colon polyps Neg Hx     Social History   Socioeconomic History   Marital status: Married    Spouse name: Not on file   Number of children: Not  on file   Years of education: Not on file   Highest education level: Not on file  Occupational History   Not on file  Tobacco Use   Smoking status: Never   Smokeless tobacco: Never  Vaping Use   Vaping Use: Never used  Substance and Sexual Activity   Alcohol use: Yes    Alcohol/week: 20.0 standard drinks of alcohol    Types: 20 Cans of beer per week   Drug use: No   Sexual activity: Not on file  Other Topics Concern   Not on file  Social History Narrative   Not on file   Social Determinants of Health   Financial Resource Strain: Not on file  Food Insecurity: Not on file  Transportation Needs: Not on file  Physical Activity: Not on file  Stress: Not on file  Social Connections: Not on file  Intimate Partner Violence: Not on file    Review of Systems:  All systems reviewed were negative except where noted in HPI.   Physical Exam: General:  Alert, well-developed, in NAD Head:  Normocephalic and atraumatic. Eyes:  Sclera clear, no icterus.   Conjunctiva pink. Ears:  Normal auditory acuity. Mouth:  No deformity or lesions.  Neck:  Supple; no masses. Lungs:  Clear throughout to auscultation.   No wheezes, crackles, or rhonchi.  Heart:  Regular rate and rhythm; no murmurs. Abdomen:  Soft, nondistended, nontender. No masses, hepatomegaly. No palpable masses.  Normal bowel sounds.    Rectal:  Deferred   Msk:  Symmetrical without gross deformities. Extremities:  Without edema. Neurologic:  Alert and  oriented x 4; grossly normal neurologically. Skin:  Intact without significant lesions or rashes. Psych:  Alert and cooperative. Normal mood and affect.   Derek [name]. Fuller Plan  03/17/2022, 8:52 AM See Shea Evans,  GI, to contact our on call provider

## 2022-03-18 ENCOUNTER — Telehealth: Payer: Self-pay | Admitting: *Deleted

## 2022-03-18 NOTE — Telephone Encounter (Signed)
  Follow up Call-     03/17/2022    8:17 AM  Call back number  Post procedure Call Back phone  # [phone]  Permission to leave phone message Yes     Patient questions:  Do you have a fever, pain , or abNo.dominal swelling? NO Pain Score  0 *  Have you tolerated food without any problems? Yes.    Have you been able to return to your normal activities? Yes.    Do you have any questions about your discharge instructions: Diet   No. Medications  No. Follow up visit  No.  Do you have questions or concerns about your Care? No.  Actions: * If pain score is 4 or above: No action needed, pain <4.

## 2022-03-25 ENCOUNTER — Encounter: Payer: Self-pay | Admitting: Gastroenterology

## 2022-05-25 IMAGING — MR MR LUMBAR SPINE W/O CM
4 of 5 series · 26 of 48 positions shown · non-contrast
Comparison: Lumbar MRI 01/15/2017.  Lumbar radiographs 12/29/2016.

CLINICAL DATA: 64-year-old male with low back pain, right foot
numbness.

EXAM:
MRI LUMBAR SPINE WITHOUT CONTRAST
TECHNIQUE: Multiplanar, multisequence MR imaging of the lumbar spine was
performed. No intravenous contrast was administered.

[Series 2: T2 · sagittal · 4.0mm · 1.09mm/px · 6 of 17 slices shown (1 of 2)]
[im 1/17]
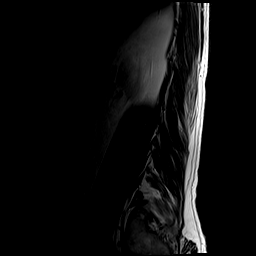
[im 4/17]
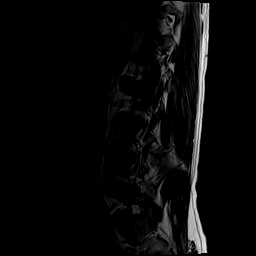
[im 7/17]
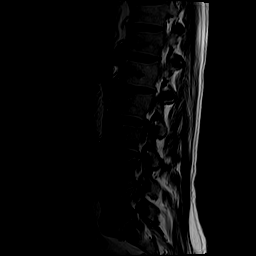
[im 10/17]
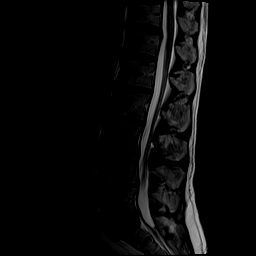
[im 13/17]
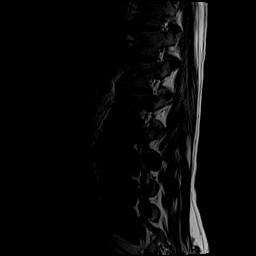
[im 17/17]
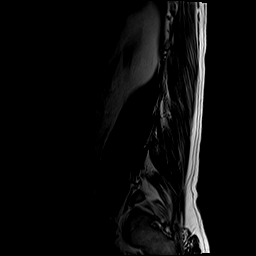

[Series 4: T1 · sagittal · 4.0mm · 1.09mm/px · 6 of 17 slices shown (1 of 2)]
[im 1/17]
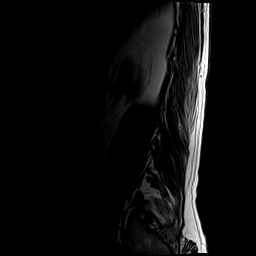
[im 4/17]
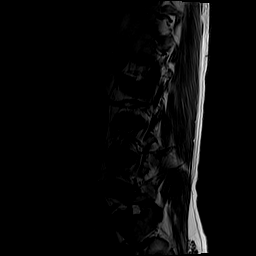
[im 7/17]
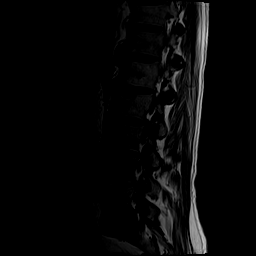
[im 10/17]
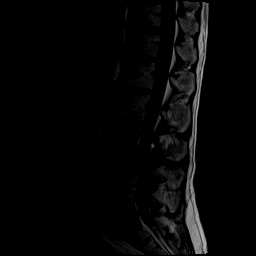
[im 13/17]
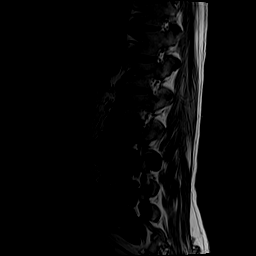
[im 17/17]
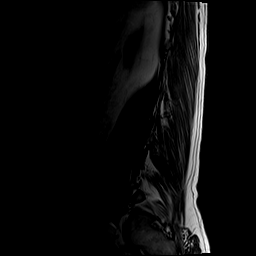

[Series 5: T2 · axial · 4.0mm · 0.39mm/px · z∈[-85,+120]mm · 9 of 42 slices shown (2 of 2)]
[im 1/42]
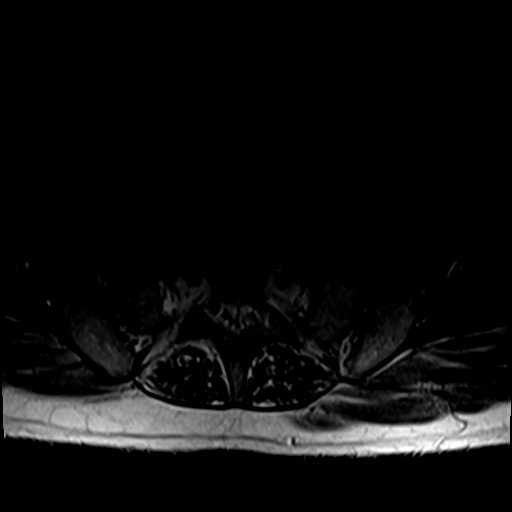
[im 6/42]
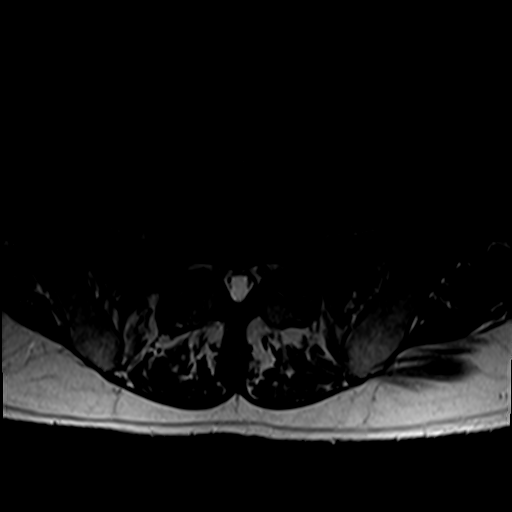
[im 12/42]
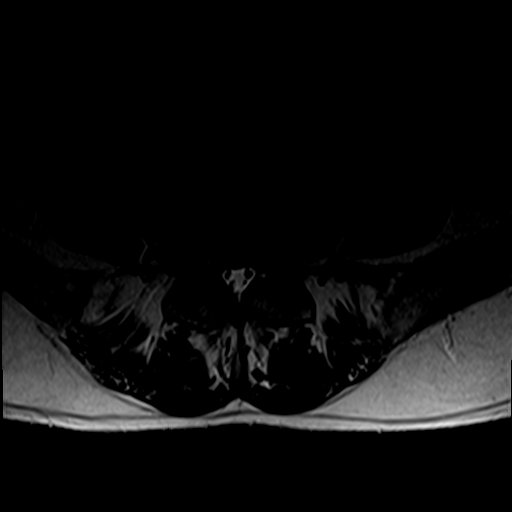
[im 18/42]
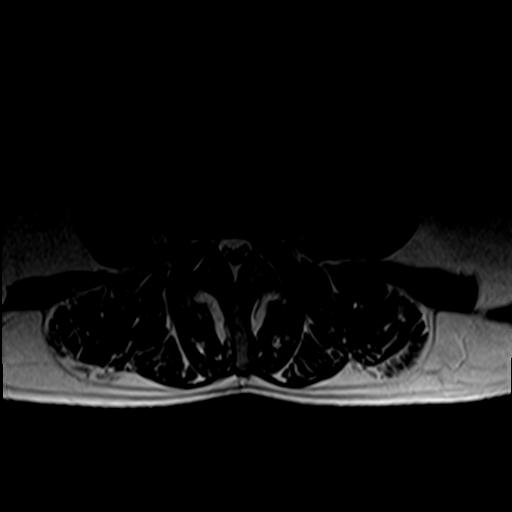
[im 21/42]
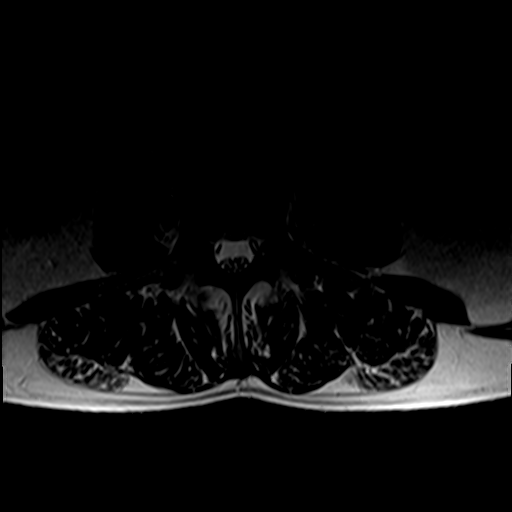
[im 24/42]
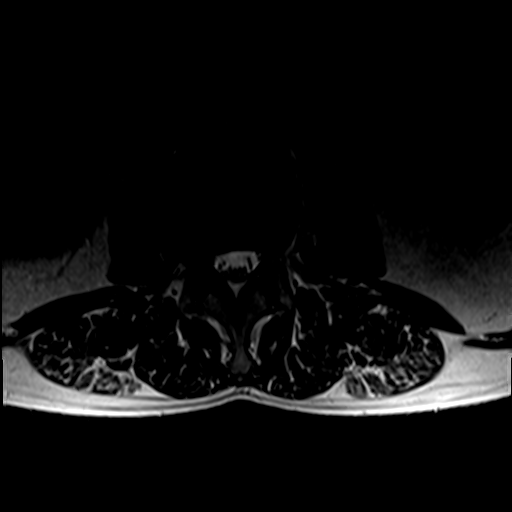
[im 30/42]
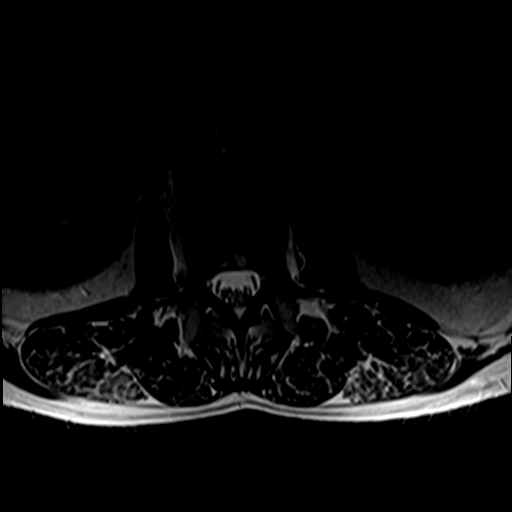
[im 36/42]
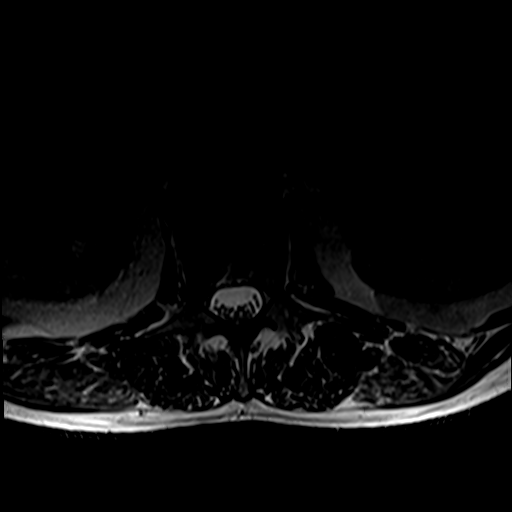
[im 42/42]
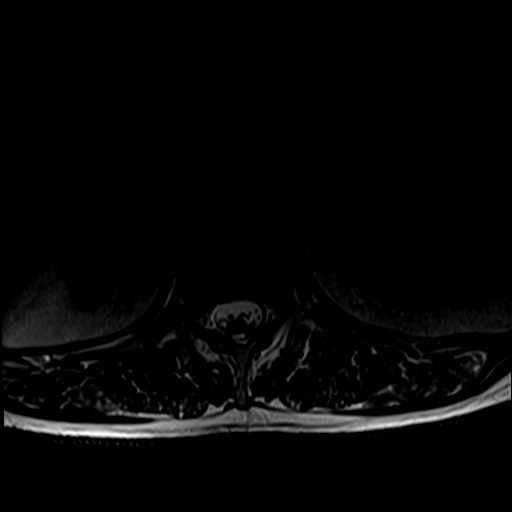

[Series 6: T1 · axial · 4.0mm · 0.39mm/px · z∈[-85,+92]mm · 5 of 42 slices shown (2 of 2)]
[im 1/42]
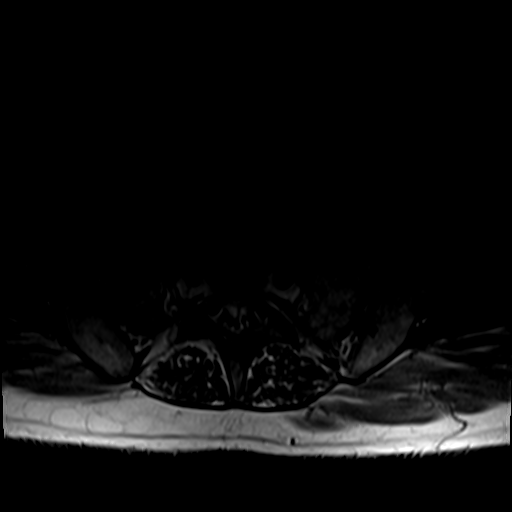
[im 6/42]
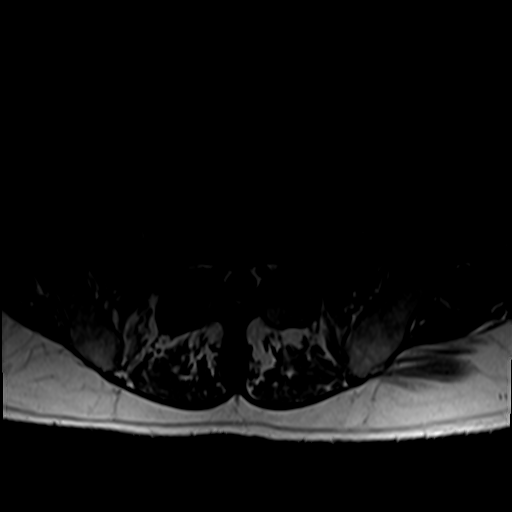
[im 12/42]
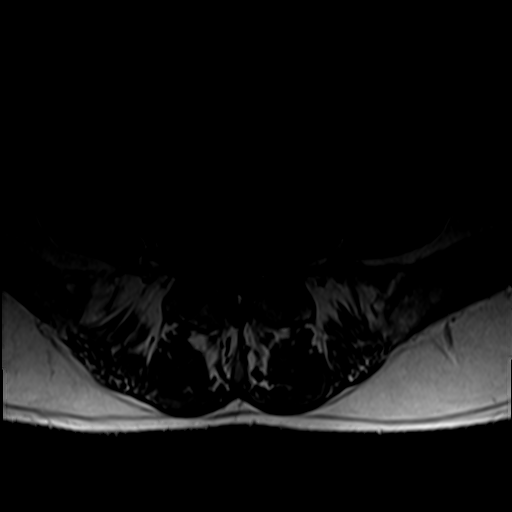
[im 21/42]
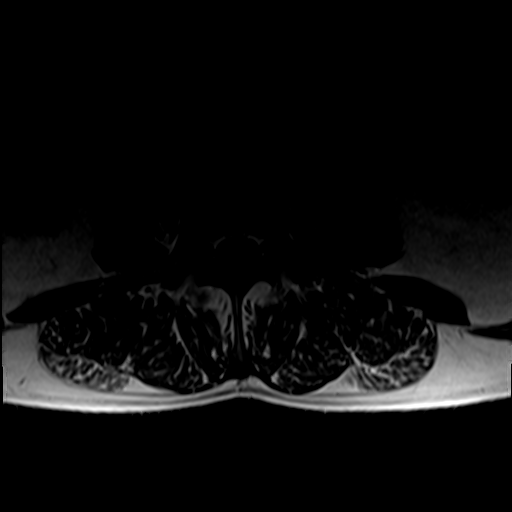
[im 36/42]
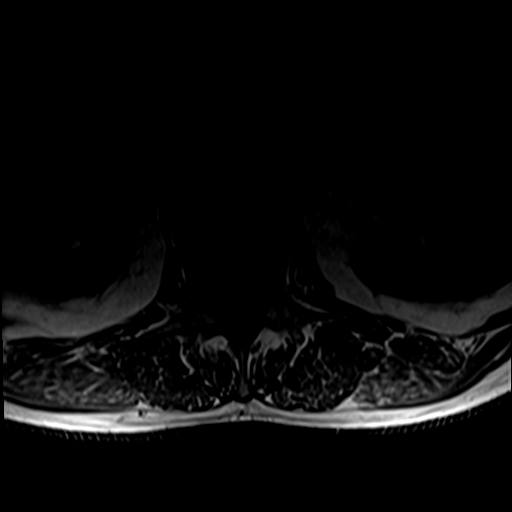

[26 of 48 positions shown; findings below may reference images not displayed]

FINDINGS: Segmentation: Normal on the comparison radiographs, the same
numbering system used on the prior MRI.

Alignment: Stable since 6233. Mild straightening of lumbar lordosis.
No spondylolisthesis.

Vertebrae: No marrow edema or evidence of acute osseous abnormality.
Visualized bone marrow signal is within normal limits. Chronic
endplate irregularity throughout the lower thoracic and lumbar
spine. Intact visible sacrum and SI joints.

Conus medullaris and cauda equina: Conus extends to the T12-L1
level. No lower spinal cord or conus signal abnormality.

Paraspinal and other soft tissues: Negative.

Disc levels:

T12-L1: Stable small disc bulge or protrusion just caudal to the
disc space. No stenosis.

T12-L1:  Negative.

L1-L2:  Negative.

L2-L3: Broad-based right lateral recess and foraminal disc
protrusion is chronic and stable. Associated mild endplate spurring.
Mild right lateral recess and moderate right L2 neural foraminal
stenosis appears stable.

L3-L4: Chronic mild circumferential disc bulge. Mild posterior
element hypertrophy. Broad-based subarticular disc greater on the
left. No significant spinal stenosis. But mild left lateral recess
and mild to moderate left foraminal stenosis appear increased since
6233.

L4-L5: Chronic severe disc space loss with right eccentric
circumferential disc osteophyte complex and moderate chronic facet
hypertrophy. No significant spinal stenosis. Chronic osseous right
lateral recess and mild bilateral L4 foraminal stenosis is stable.

L5-S1: Small but increased right lateral recess disc protrusion
suspected since 6233 (series 5, image 36) and not well correlated on
the sagittal images. Underlying mild to moderate facet hypertrophy
greater on the right. Increased right lateral recess stenosis (right
S1 nerve level). No spinal stenosis. Right far lateral disc
osteophyte complex but only mild right L5 foraminal stenosis which
is stable.
IMPRESSION: 1. Small but increased right lateral recess disc protrusion
suspected at L5-S1 since the 6233 MRI. Increased right lateral
recess stenosis. Query right S1 radiculitis.

2. Otherwise stable chronic right side degenerative neural
impingement since 6233:
- L2-L3 rightward disc affecting the right lateral recess and
foramen at the right L2, L3 nerve levels.
- L4-L5 severe disc space loss with rightward chronic disc
osteophyte complex affecting the right lateral recess and both
neural foramen at the right L5, bilateral L4 nerve levels.

3. No significant lumbar spinal stenosis.

## 2022-10-25 ENCOUNTER — Encounter: Payer: Self-pay | Admitting: Physical Therapy

## 2022-10-25 ENCOUNTER — Ambulatory Visit: Payer: 59 | Attending: Orthopaedic Surgery | Admitting: Physical Therapy

## 2022-10-25 DIAGNOSIS — R262 Difficulty in walking, not elsewhere classified: Secondary | ICD-10-CM | POA: Diagnosis present

## 2022-10-25 DIAGNOSIS — M25661 Stiffness of right knee, not elsewhere classified: Secondary | ICD-10-CM | POA: Diagnosis present

## 2022-10-25 DIAGNOSIS — R600 Localized edema: Secondary | ICD-10-CM | POA: Diagnosis present

## 2022-10-25 DIAGNOSIS — M25561 Pain in right knee: Secondary | ICD-10-CM | POA: Diagnosis present

## 2022-10-25 NOTE — Therapy (Signed)
OUTPATIENT PHYSICAL THERAPY LOWER EXTREMITY EVALUATION   Patient Name: Derek [name] MRN: [id number] DOB:Oct 31, 1955, 67 y.o., male Today's Date: 10/25/2022  END OF SESSION:  PT End of Session - 10/25/22 1038     Visit Number 1    Date for PT Re-Evaluation 01/24/23    Authorization Type [employer]    PT Start Time 1030    PT Stop Time 1120    PT Time Calculation (min) 50 min    Activity Tolerance Patient tolerated treatment well;Patient limited by pain    Behavior During Therapy [facility] for tasks assessed/performed             Past Medical History:  Diagnosis Date   Arthritis    knee, back, hip   Past Surgical History:  Procedure Laterality Date   COLONOSCOPY  01/15/2013   POLYPECTOMY     thumb surg  2952,8413   right thumb sx x2   Patient Active Problem List   Diagnosis Date Noted   Left Achilles tendinitis 12/26/2017   Degenerative arthritis of right knee 11/14/2017   Lumbar radiculopathy 01/04/2017    PCP: Ludwig Clarks, PA  REFERRING PROVIDER: Jerl Santos, MD  REFERRING DIAG: s/p right TKA  THERAPY DIAG:  Acute pain of right knee  Stiffness of right knee, not elsewhere classified  Localized edema  Difficulty in walking, not elsewhere classified  Rationale for Evaluation and Treatment: Rehabilitation  ONSET DATE: 10/22/22  SUBJECTIVE:   SUBJECTIVE STATEMENT: Patient underwent a right TKA on 10/21/22, had issues with bleeding of the bandage and had to go back in on Friday.  Reports had a very rough weekend had a fall due to passing out, re dressed the wound and had an x-ray.  PERTINENT HISTORY: Back pain, knee pain PAIN:  Are you having pain? Yes: NPRS scale: 4/10 Pain location: right knee Pain description: ache, sore Aggravating factors: bending, walking , not taking pain meds, pain up to 9/10 Relieving factors: pain meds, elevation, pain a 4-5/10  PRECAUTIONS: None  RED FLAGS: None   WEIGHT BEARING RESTRICTIONS: No  FALLS:  Has patient fallen in last  6 months? Yes. Number of falls 1  LIVING ENVIRONMENT: Lives with: lives with their family and lives with their spouse Lives in: House/apartment Stairs: Yes: Internal: 12 steps; can reach both Has following equipment at home: Walker - 2 wheeled  OCCUPATION: data center work, climbs ladder, lifting, computer  PLOF: Independent  PATIENT GOALS: have less pain, good motions, walk normal  NEXT MD VISIT: 11/01/22  OBJECTIVE:   DIAGNOSTIC FINDINGS: see above  PATIENT SURVEYS:  FOTO 20  COGNITION: Overall cognitive status: Within functional limits for tasks assessed     SENSATION: WFL  EDEMA:  Circumferential: right med patella 50 cm, left 42 cm  PALPATION: Swollen, tender ecchymosis mid right thigh and around the right knee, he does report a fall over the weekend, did have x-rays.  Had large gauze wrap and two ace bandages on plus the compression stocking.  LOWER EXTREMITY ROM:  Active ROM AROM Right eval PROM right eval  Hip flexion    Hip extension    Hip abduction    Hip adduction    Hip internal rotation    Hip external rotation    Knee flexion 82 86  Knee extension 18 12  Ankle dorsiflexion    Ankle plantarflexion    Ankle inversion    Ankle eversion     (Blank rows = not tested)  LOWER EXTREMITY MMT: Not tested due to pain  LOWER EXTREMITY SPECIAL TESTS:  Knee special tests: quad lag, ballotable patella  FUNCTIONAL TESTS:  5 times sit to stand: 40 seconds Timed up and go (TUG): 32 seconds with FWW  GAIT: Distance walked: 60 feet Assistive device utilized: Walker - 2 wheeled Level of assistance: SBA Comments: slow, step to gait pattern, antalgic on the right   TODAY'S TREATMENT:                                                                                                                              DATE:  10/25/22 Vaso medium pressure with the right knee elevated 35 degrees F    PATIENT EDUCATION:  Education details: HEP/POC Person  educated: Patient and Spouse Education method: Explanation, Demonstration, Actor cues, Verbal cues, and Handouts Education comprehension: verbalized understanding  HOME EXERCISE PROGRAM: Access Code: [email or web address] URL: [email or web address] Date: 10/25/2022 Prepared by: Stacie Glaze  Exercises - Seated Knee Flexion AAROM  - 2 x daily - 7 x weekly - 1 sets - 10 reps - 30 hold - Supine Short Arc Quad  - 2 x daily - 7 x weekly - 2 sets - 10 reps - 3 hold - Supine Heel Slide  - 2 x daily - 7 x weekly - 1 sets - 10 reps - 5 hold  ASSESSMENT:  CLINICAL IMPRESSION: Patient is a 67 y.o. male who was seen today for physical therapy evaluation and treatment for right TKA performed 10/21/22, has had some complications of bleeding and then a fall, he has significant bruising in the right thigh and the right knee.  Has gauze wrap, two ace bandages and compression stocking on, I took the ace bandages off to help with ROM and him feeling that he can move the knee.   OBJECTIVE IMPAIRMENTS: Abnormal gait, cardiopulmonary status limiting activity, decreased activity tolerance, decreased balance, decreased coordination, decreased endurance, decreased mobility, difficulty walking, decreased ROM, decreased strength, increased edema, increased muscle spasms, impaired flexibility, improper body mechanics, and pain.   REHAB POTENTIAL: Good  CLINICAL DECISION MAKING: Evolving/moderate complexity  EVALUATION COMPLEXITY: Low   GOALS: Goals reviewed with patient? Yes  SHORT TERM GOALS: Target date: 11/08/22 Independent with initial HEP Goal status: INITIAL  LONG TERM GOALS: Target date: 01/24/23  Independent with advanced HEP Goal status: INITIAL  2.  Increase AROM to 2-120 degrees Goal status: INITIAL  3.  Walk without assistive device Goal status: INITIAL  4.  Go up and down stairs step over step Goal status: INITIAL  5.  Walk outside 700 feet with SPC Goal status:  INITIAL   PLAN:  PT FREQUENCY: 2x/week  PT DURATION: 12 weeks  PLANNED INTERVENTIONS: Therapeutic exercises, Therapeutic activity, Neuromuscular re-education, Balance training, Gait training, Patient/Family education, Self Care, Joint mobilization, Joint manipulation, Stair training, Electrical stimulation, Cryotherapy, Taping, Vasopneumatic device, and Manual therapy  PLAN FOR NEXT SESSION: slowly start ROM, strength and functional gait, be careful with the bleeding   Jearld Lesch, PT 10/25/2022,  10:38 AM

## 2022-10-27 ENCOUNTER — Ambulatory Visit: Payer: 59 | Admitting: Physical Therapy

## 2022-10-27 ENCOUNTER — Encounter: Payer: Self-pay | Admitting: Physical Therapy

## 2022-10-27 DIAGNOSIS — M25561 Pain in right knee: Secondary | ICD-10-CM | POA: Diagnosis not present

## 2022-10-27 DIAGNOSIS — M25661 Stiffness of right knee, not elsewhere classified: Secondary | ICD-10-CM

## 2022-10-27 DIAGNOSIS — R600 Localized edema: Secondary | ICD-10-CM

## 2022-10-27 DIAGNOSIS — R262 Difficulty in walking, not elsewhere classified: Secondary | ICD-10-CM

## 2022-10-27 NOTE — Therapy (Signed)
OUTPATIENT PHYSICAL THERAPY LOWER EXTREMITY EVALUATION   Patient Name: Derek [name] MRN: [id number] DOB:Oct 31, 1955, 67 y.o., male Today's Date: 10/27/2022  END OF SESSION:  PT End of Session - 10/27/22 1307     Visit Number 2    Date for PT Re-Evaluation 01/24/23    PT Start Time 1307    PT Stop Time 1345    PT Time Calculation (min) 38 min    Activity Tolerance Patient tolerated treatment well;Patient limited by pain    Behavior During Therapy [facility] for tasks assessed/performed             Past Medical History:  Diagnosis Date   Arthritis    knee, back, hip   Past Surgical History:  Procedure Laterality Date   COLONOSCOPY  01/15/2013   POLYPECTOMY     thumb surg  0865,7846   right thumb sx x2   Patient Active Problem List   Diagnosis Date Noted   Left Achilles tendinitis 12/26/2017   Degenerative arthritis of right knee 11/14/2017   Lumbar radiculopathy 01/04/2017    PCP: Ludwig Clarks, PA  REFERRING PROVIDER: Jerl Santos, MD  REFERRING DIAG: s/p right TKA  THERAPY DIAG:  Localized edema  Stiffness of right knee, not elsewhere classified  Acute pain of right knee  Difficulty in walking, not elsewhere classified  Rationale for Evaluation and Treatment: Rehabilitation  ONSET DATE: 10/22/22  SUBJECTIVE:   SUBJECTIVE STATEMENT: No new issues since evaluation  PERTINENT HISTORY: Back pain, knee pain PAIN:  Are you having pain? Yes: NPRS scale: 4/10 Pain location: right knee Pain description: ache, sore Aggravating factors: bending, walking , not taking pain meds, pain up to 9/10 Relieving factors: pain meds, elevation, pain a 4-5/10  PRECAUTIONS: None  RED FLAGS: None   WEIGHT BEARING RESTRICTIONS: No  FALLS:  Has patient fallen in last 6 months? Yes. Number of falls 1  LIVING ENVIRONMENT: Lives with: lives with their family and lives with their spouse Lives in: House/apartment Stairs: Yes: Internal: 12 steps; can reach both Has following  equipment at home: Walker - 2 wheeled  OCCUPATION: data center work, climbs ladder, lifting, computer  PLOF: Independent  PATIENT GOALS: have less pain, good motions, walk normal  NEXT MD VISIT: 11/01/22  OBJECTIVE:   DIAGNOSTIC FINDINGS: see above  PATIENT SURVEYS:  FOTO 20  COGNITION: Overall cognitive status: Within functional limits for tasks assessed     SENSATION: WFL  EDEMA:  Circumferential: right med patella 50 cm, left 42 cm  PALPATION: Swollen, tender ecchymosis mid right thigh and around the right knee, he does report a fall over the weekend, did have x-rays.  Had large gauze wrap and two ace bandages on plus the compression stocking.  LOWER EXTREMITY ROM:  Active ROM AROM Right eval PROM right eval  Hip flexion    Hip extension    Hip abduction    Hip adduction    Hip internal rotation    Hip external rotation    Knee flexion 82 86  Knee extension 18 12  Ankle dorsiflexion    Ankle plantarflexion    Ankle inversion    Ankle eversion     (Blank rows = not tested)  LOWER EXTREMITY MMT: Not tested due to pain  LOWER EXTREMITY SPECIAL TESTS:  Knee special tests: quad lag, ballotable patella  FUNCTIONAL TESTS:  5 times sit to stand: 40 seconds Timed up and go (TUG): 32 seconds with FWW  GAIT: Distance walked: 60 feet Assistive device utilized: Walker - 2  wheeled Level of assistance: SBA Comments: slow, step to gait pattern, antalgic on the right   TODAY'S TREATMENT:                                                                                                                              DATE:  10/27/22 NuStep L5 x 5 min R knee PROM w/ end range holds STM to R quad, patella mobs  RKE quad sets LAQ RLE 2x10 RLE HS curls red x15 Vaso medium pressure with the right knee elevated 35 degrees F   10/25/22 Vaso medium pressure with the right knee elevated 35 degrees F    PATIENT EDUCATION:  Education details: HEP/POC Person educated:  Patient and Spouse Education method: Explanation, Demonstration, Actor cues, Verbal cues, and Handouts Education comprehension: verbalized understanding  HOME EXERCISE PROGRAM: Access Code: [email or web address] URL: [email or web address] Date: 10/25/2022 Prepared by: Stacie Glaze  Exercises - Seated Knee Flexion AAROM  - 2 x daily - 7 x weekly - 1 sets - 10 reps - 30 hold - Supine Short Arc Quad  - 2 x daily - 7 x weekly - 2 sets - 10 reps - 3 hold - Supine Heel Slide  - 2 x daily - 7 x weekly - 1 sets - 10 reps - 5 hold  ASSESSMENT:  CLINICAL IMPRESSION: Patient is a 67 y.o. male who was seen today for physical therapy  treatment for right TKA performed 10/21/22, has had some complications of bleeding and then a fall, he has significant bruising in the right thigh and the right knee. Pt tolerated and initial progression to TE well. Some hesitation during session but seem to relax as session progressed. End rane pain with PROM Good quad contraction with quad sets. Cues needed during session nit to guard RLE with mobility   OBJECTIVE IMPAIRMENTS: Abnormal gait, cardiopulmonary status limiting activity, decreased activity tolerance, decreased balance, decreased coordination, decreased endurance, decreased mobility, difficulty walking, decreased ROM, decreased strength, increased edema, increased muscle spasms, impaired flexibility, improper body mechanics, and pain.   REHAB POTENTIAL: Good  CLINICAL DECISION MAKING: Evolving/moderate complexity  EVALUATION COMPLEXITY: Low   GOALS: Goals reviewed with patient? Yes  SHORT TERM GOALS: Target date: 11/08/22 Independent with initial HEP Goal status: INITIAL  LONG TERM GOALS: Target date: 01/24/23  Independent with advanced HEP Goal status: INITIAL  2.  Increase AROM to 2-120 degrees Goal status: INITIAL  3.  Walk without assistive device Goal status: INITIAL  4.  Go up and down stairs step over step Goal status:  INITIAL  5.  Walk outside 700 feet with SPC Goal status: INITIAL   PLAN:  PT FREQUENCY: 2x/week  PT DURATION: 12 weeks  PLANNED INTERVENTIONS: Therapeutic exercises, Therapeutic activity, Neuromuscular re-education, Balance training, Gait training, Patient/Family education, Self Care, Joint mobilization, Joint manipulation, Stair training, Electrical stimulation, Cryotherapy, Taping, Vasopneumatic device, and Manual therapy  PLAN FOR NEXT SESSION: slowly start ROM, strength and functional gait, be careful  with the bleeding   Grayce Sessions, PTA 10/27/2022, 1:08 PM

## 2022-10-29 ENCOUNTER — Ambulatory Visit: Payer: 59 | Admitting: Physical Therapy

## 2022-10-29 ENCOUNTER — Encounter: Payer: Self-pay | Admitting: Physical Therapy

## 2022-10-29 DIAGNOSIS — M25561 Pain in right knee: Secondary | ICD-10-CM

## 2022-10-29 DIAGNOSIS — R600 Localized edema: Secondary | ICD-10-CM

## 2022-10-29 DIAGNOSIS — M25661 Stiffness of right knee, not elsewhere classified: Secondary | ICD-10-CM

## 2022-10-29 DIAGNOSIS — R262 Difficulty in walking, not elsewhere classified: Secondary | ICD-10-CM

## 2022-10-29 NOTE — Therapy (Signed)
OUTPATIENT PHYSICAL THERAPY LOWER EXTREMITY EVALUATION   Patient Name: Derek [name] MRN: [id number] DOB:10/27/1955, 67 y.o., male Today's Date: 10/29/2022  END OF SESSION:  PT End of Session - 10/29/22 1015     Visit Number 3    Date for PT Re-Evaluation 01/24/23    PT Start Time 1014    PT Stop Time 1100    PT Time Calculation (min) 46 min    Activity Tolerance Patient tolerated treatment well    Behavior During Therapy [facility] for tasks assessed/performed             Past Medical History:  Diagnosis Date   Arthritis    knee, back, hip   Past Surgical History:  Procedure Laterality Date   COLONOSCOPY  01/15/2013   POLYPECTOMY     thumb surg  8413,2440   right thumb sx x2   Patient Active Problem List   Diagnosis Date Noted   Left Achilles tendinitis 12/26/2017   Degenerative arthritis of right knee 11/14/2017   Lumbar radiculopathy 01/04/2017    PCP: Ludwig Clarks, PA  REFERRING PROVIDER: Jerl Santos, MD  REFERRING DIAG: s/p right TKA  THERAPY DIAG:  Localized edema  Stiffness of right knee, not elsewhere classified  Acute pain of right knee  Difficulty in walking, not elsewhere classified  Rationale for Evaluation and Treatment: Rehabilitation  ONSET DATE: 10/22/22  SUBJECTIVE:   SUBJECTIVE STATEMENT: Doing ok  PERTINENT HISTORY: Back pain, knee pain PAIN:  Are you having pain? Yes: NPRS scale: 4-5/10 Pain location: right knee Pain description: ache, sore Aggravating factors: bending, walking , not taking pain meds, pain up to 9/10 Relieving factors: pain meds, elevation, pain a 4-5/10  PRECAUTIONS: None  RED FLAGS: None   WEIGHT BEARING RESTRICTIONS: No  FALLS:  Has patient fallen in last 6 months? Yes. Number of falls 1  LIVING ENVIRONMENT: Lives with: lives with their family and lives with their spouse Lives in: House/apartment Stairs: Yes: Internal: 12 steps; can reach both Has following equipment at home: Walker - 2  wheeled  OCCUPATION: data center work, climbs ladder, lifting, computer  PLOF: Independent  PATIENT GOALS: have less pain, good motions, walk normal  NEXT MD VISIT: 11/01/22  OBJECTIVE:   DIAGNOSTIC FINDINGS: see above  PATIENT SURVEYS:  FOTO 20  COGNITION: Overall cognitive status: Within functional limits for tasks assessed     SENSATION: WFL  EDEMA:  Circumferential: right med patella 50 cm, left 42 cm  PALPATION: Swollen, tender ecchymosis mid right thigh and around the right knee, he does report a fall over the weekend, did have x-rays.  Had large gauze wrap and two ace bandages on plus the compression stocking.  LOWER EXTREMITY ROM:  Active ROM AROM Right eval PROM right eval  Hip flexion    Hip extension    Hip abduction    Hip adduction    Hip internal rotation    Hip external rotation    Knee flexion 82 86  Knee extension 18 12  Ankle dorsiflexion    Ankle plantarflexion    Ankle inversion    Ankle eversion     (Blank rows = not tested)  LOWER EXTREMITY MMT: Not tested due to pain  LOWER EXTREMITY SPECIAL TESTS:  Knee special tests: quad lag, ballotable patella  FUNCTIONAL TESTS:  5 times sit to stand: 40 seconds Timed up and go (TUG): 32 seconds with FWW  GAIT: Distance walked: 60 feet Assistive device utilized: Walker - 2 wheeled Level of assistance: SBA Comments:  slow, step to gait pattern, antalgic on the right   TODAY'S TREATMENT:                                                                                                                              DATE:  10/29/22 NuStep L5 x 6  min R knee PROM w/ end range holds STM to R quad, patella mobs S2S x10, x5   HS curls RLE green 2x15 LAQ 2lb 3 x10 RLE  Gait SPC ~ 145ft decrease TKE  Vaso medium pressure with the right knee elevated 35 degrees F   10/27/22 NuStep L5 x 5 min R knee PROM w/ end range holds STM to R quad, patella mobs  RKE quad sets LAQ RLE 2x10 RLE HS curls  red x15 Vaso medium pressure with the right knee elevated 35 degrees F   10/25/22 Vaso medium pressure with the right knee elevated 35 degrees F    PATIENT EDUCATION:  Education details: HEP/POC Person educated: Patient and Spouse Education method: Explanation, Demonstration, Actor cues, Verbal cues, and Handouts Education comprehension: verbalized understanding  HOME EXERCISE PROGRAM: Access Code: [email or web address] URL: [email or web address] Date: 10/25/2022 Prepared by: Stacie Glaze  Exercises - Seated Knee Flexion AAROM  - 2 x daily - 7 x weekly - 1 sets - 10 reps - 30 hold - Supine Short Arc Quad  - 2 x daily - 7 x weekly - 2 sets - 10 reps - 3 hold - Supine Heel Slide  - 2 x daily - 7 x weekly - 1 sets - 10 reps - 5 hold  ASSESSMENT:  CLINICAL IMPRESSION: Patient is a 67 y.o. male who was seen today for physical therapy  treatment for right TKA performed 10/21/22, has had some complications of bleeding and then a fall, he has significant bruising in the right thigh and the right knee. Continued to progress with R knee strength and ROM. Progressed to gait with SPC. Some compensation with S2S.  End range pain with PROM remains. Cues needed during session nit to guard RLE with mobility   OBJECTIVE IMPAIRMENTS: Abnormal gait, cardiopulmonary status limiting activity, decreased activity tolerance, decreased balance, decreased coordination, decreased endurance, decreased mobility, difficulty walking, decreased ROM, decreased strength, increased edema, increased muscle spasms, impaired flexibility, improper body mechanics, and pain.   REHAB POTENTIAL: Good  CLINICAL DECISION MAKING: Evolving/moderate complexity  EVALUATION COMPLEXITY: Low   GOALS: Goals reviewed with patient? Yes  SHORT TERM GOALS: Target date: 11/08/22 Independent with initial HEP Goal status: INITIAL  LONG TERM GOALS: Target date: 01/24/23  Independent with advanced HEP Goal status:  INITIAL  2.  Increase AROM to 2-120 degrees Goal status: INITIAL  3.  Walk without assistive device Goal status: Progressing 10/29/22  4.  Go up and down stairs step over step Goal status: INITIAL  5.  Walk outside 700 feet with SPC Goal status: INITIAL   PLAN:  PT FREQUENCY: 2x/week  PT DURATION: 12 weeks  PLANNED INTERVENTIONS: Therapeutic exercises, Therapeutic activity, Neuromuscular re-education, Balance training, Gait training, Patient/Family education, Self Care, Joint mobilization, Joint manipulation, Stair training, Electrical stimulation, Cryotherapy, Taping, Vasopneumatic device, and Manual therapy  PLAN FOR NEXT SESSION: slowly start ROM, strength and functional gait, be careful with the bleeding   Grayce Sessions, PTA 10/29/2022, 10:16 AM

## 2022-11-01 ENCOUNTER — Ambulatory Visit: Payer: 59 | Admitting: Physical Therapy

## 2022-11-02 ENCOUNTER — Ambulatory Visit: Payer: 59 | Admitting: Physical Therapy

## 2022-11-02 ENCOUNTER — Encounter: Payer: Self-pay | Admitting: Physical Therapy

## 2022-11-02 DIAGNOSIS — M25561 Pain in right knee: Secondary | ICD-10-CM | POA: Diagnosis not present

## 2022-11-02 DIAGNOSIS — R262 Difficulty in walking, not elsewhere classified: Secondary | ICD-10-CM

## 2022-11-02 DIAGNOSIS — M25661 Stiffness of right knee, not elsewhere classified: Secondary | ICD-10-CM

## 2022-11-02 DIAGNOSIS — R600 Localized edema: Secondary | ICD-10-CM

## 2022-11-02 NOTE — Addendum Note (Signed)
Addended by: Jearld Lesch on: 11/02/2022 08:38 AM   Modules accepted: Orders

## 2022-11-02 NOTE — Therapy (Signed)
OUTPATIENT PHYSICAL THERAPY LOWER EXTREMITY EVALUATION   Patient Name: Derek [name] MRN: [id number] DOB:1955/09/27, 67 y.o., male Today's Date: 11/02/2022  END OF SESSION:  PT End of Session - 11/02/22 1021     Visit Number 4    Number of Visits 13    Date for PT Re-Evaluation 01/24/23    Authorization Type [employer]    PT Start Time 1011    PT Stop Time 1108    PT Time Calculation (min) 57 min    Activity Tolerance Patient tolerated treatment well    Behavior During Therapy WFL for tasks assessed/performed             Past Medical History:  Diagnosis Date   Arthritis    knee, back, hip   Past Surgical History:  Procedure Laterality Date   COLONOSCOPY  01/15/2013   POLYPECTOMY     thumb surg  8413,2440   right thumb sx x2   Patient Active Problem List   Diagnosis Date Noted   Left Achilles tendinitis 12/26/2017   Degenerative arthritis of right knee 11/14/2017   Lumbar radiculopathy 01/04/2017    PCP: Ludwig Clarks, PA  REFERRING PROVIDER: Jerl Santos, MD  REFERRING DIAG: s/p right TKA  THERAPY DIAG:  Localized edema  Stiffness of right knee, not elsewhere classified  Acute pain of right knee  Difficulty in walking, not elsewhere classified  Rationale for Evaluation and Treatment: Rehabilitation  ONSET DATE: 10/22/22  SUBJECTIVE:   SUBJECTIVE STATEMENT: Saw MD yesterday, feels like it is going well, now using a SPC  PERTINENT HISTORY: Back pain, knee pain PAIN:  Are you having pain? Yes: NPRS scale: 4-5/10 Pain location: right knee Pain description: ache, sore Aggravating factors: bending, walking , not taking pain meds, pain up to 9/10 Relieving factors: pain meds, elevation, pain a 4-5/10  PRECAUTIONS: None  RED FLAGS: None   WEIGHT BEARING RESTRICTIONS: No  FALLS:  Has patient fallen in last 6 months? Yes. Number of falls 1  LIVING ENVIRONMENT: Lives with: lives with their family and lives with their spouse Lives in:  House/apartment Stairs: Yes: Internal: 12 steps; can reach both Has following equipment at home: Walker - 2 wheeled  OCCUPATION: data center work, climbs ladder, lifting, computer  PLOF: Independent  PATIENT GOALS: have less pain, good motions, walk normal  NEXT MD VISIT: 11/01/22  OBJECTIVE:   DIAGNOSTIC FINDINGS: see above  PATIENT SURVEYS:  FOTO 20  COGNITION: Overall cognitive status: Within functional limits for tasks assessed     SENSATION: WFL  EDEMA:  Circumferential: right med patella 50 cm, left 42 cm  PALPATION: Swollen, tender ecchymosis mid right thigh and around the right knee, he does report a fall over the weekend, did have x-rays.  Had large gauze wrap and two ace bandages on plus the compression stocking.  LOWER EXTREMITY ROM:  Active ROM AROM Right eval PROM right eval  Hip flexion    Hip extension    Hip abduction    Hip adduction    Hip internal rotation    Hip external rotation    Knee flexion 82 86  Knee extension 18 12  Ankle dorsiflexion    Ankle plantarflexion    Ankle inversion    Ankle eversion     (Blank rows = not tested)  LOWER EXTREMITY MMT: Not tested due to pain  LOWER EXTREMITY SPECIAL TESTS:  Knee special tests: quad lag, ballotable patella  FUNCTIONAL TESTS:  5 times sit to stand: 40 seconds Timed up and  go (TUG): 32 seconds with FWW  GAIT: Distance walked: 60 feet Assistive device utilized: Walker - 2 wheeled Level of assistance: SBA Comments: slow, step to gait pattern, antalgic on the right   TODAY'S TREATMENT:                                                                                                                              DATE:  11/02/22 PROM of the right knee flexion and extension LAQ no weight working on [employer] tband HS curls Nustep level 4 x 6 minutes Right leg HS curl with 15# 2x10 Leg press 20# 2x10 both legs, no weight working on flexion, then right leg only with no weight On airex  balance beam side stepping On airex weight shift and then marching and ball toss Side stepping Vaso high pressure in elevation 35 degrees  10/29/22 NuStep L5 x 6  min R knee PROM w/ end range holds STM to R quad, patella mobs S2S x10, x5   HS curls RLE green 2x15 LAQ 2lb 3 x10 RLE  Gait SPC ~ 141ft decrease TKE  Vaso medium pressure with the right knee elevated 35 degrees F   10/27/22 NuStep L5 x 5 min R knee PROM w/ end range holds STM to R quad, patella mobs  RKE quad sets LAQ RLE 2x10 RLE HS curls red x15 Vaso medium pressure with the right knee elevated 35 degrees F   10/25/22 Vaso medium pressure with the right knee elevated 35 degrees F    PATIENT EDUCATION:  Education details: HEP/POC Person educated: Patient and Spouse Education method: Explanation, Demonstration, Actor cues, Verbal cues, and Handouts Education comprehension: verbalized understanding  HOME EXERCISE PROGRAM: Access Code: [email or web address] URL: [email or web address] Date: 10/25/2022 Prepared by: Stacie Glaze  Exercises - Seated Knee Flexion AAROM  - 2 x daily - 7 x weekly - 1 sets - 10 reps - 30 hold - Supine Short Arc Quad  - 2 x daily - 7 x weekly - 2 sets - 10 reps - 3 hold - Supine Heel Slide  - 2 x daily - 7 x weekly - 1 sets - 10 reps - 5 hold  ASSESSMENT:  CLINICAL IMPRESSION: Patient is a 66 y.o. male who was seen today for physical therapy  treatment for right TKA performed 10/21/22, has had some complications of bleeding and then a fall, We are progressing with strength and function and started some proprioception today, he did well but was hesitant at first   OBJECTIVE IMPAIRMENTS: Abnormal gait, cardiopulmonary status limiting activity, decreased activity tolerance, decreased balance, decreased coordination, decreased endurance, decreased mobility, difficulty walking, decreased ROM, decreased strength, increased edema, increased muscle spasms, impaired flexibility, improper  body mechanics, and pain.   REHAB POTENTIAL: Good  CLINICAL DECISION MAKING: Evolving/moderate complexity  EVALUATION COMPLEXITY: Low   GOALS: Goals reviewed with patient? Yes  SHORT TERM GOALS: Target date: 11/08/22 Independent with initial HEP Goal status: met 11/02/22  LONG TERM GOALS: Target date: 01/24/23  Independent with advanced HEP Goal status: INITIAL  2.  Increase AROM to 2-120 degrees Goal status: INITIAL  3.  Walk without assistive device Goal status: Progressing 10/29/22  4.  Go up and down stairs step over step Goal status: INITIAL  5.  Walk outside 700 feet with SPC Goal status: INITIAL   PLAN:  PT FREQUENCY: 2x/week  PT DURATION: 12 weeks  PLANNED INTERVENTIONS: Therapeutic exercises, Therapeutic activity, Neuromuscular re-education, Balance training, Gait training, Patient/Family education, Self Care, Joint mobilization, Joint manipulation, Stair training, Electrical stimulation, Cryotherapy, Taping, Vasopneumatic device, and Manual therapy  PLAN FOR NEXT SESSION: continue to advance strength, ROM and function   Earlena Werst W, PT 11/02/2022, 10:22 AM

## 2022-11-03 ENCOUNTER — Encounter: Payer: Self-pay | Admitting: Physical Therapy

## 2022-11-03 ENCOUNTER — Ambulatory Visit: Payer: 59 | Admitting: Physical Therapy

## 2022-11-03 DIAGNOSIS — M25661 Stiffness of right knee, not elsewhere classified: Secondary | ICD-10-CM

## 2022-11-03 DIAGNOSIS — R600 Localized edema: Secondary | ICD-10-CM

## 2022-11-03 DIAGNOSIS — M25561 Pain in right knee: Secondary | ICD-10-CM

## 2022-11-03 DIAGNOSIS — R262 Difficulty in walking, not elsewhere classified: Secondary | ICD-10-CM

## 2022-11-03 NOTE — Therapy (Signed)
OUTPATIENT PHYSICAL THERAPY LOWER EXTREMITY EVALUATION   Patient Name: Derek [name] MRN: [id number] DOB:1955-04-19, 67 y.o., male Today's Date: 11/03/2022  END OF SESSION:  PT End of Session - 11/03/22 1105     Visit Number 5    Date for PT Re-Evaluation 01/24/23    PT Start Time 1100    PT Stop Time 1145    PT Time Calculation (min) 45 min    Activity Tolerance Patient tolerated treatment well    Behavior During Therapy [facility] for tasks assessed/performed             Past Medical History:  Diagnosis Date   Arthritis    knee, back, hip   Past Surgical History:  Procedure Laterality Date   COLONOSCOPY  01/15/2013   POLYPECTOMY     thumb surg  3244,0102   right thumb sx x2   Patient Active Problem List   Diagnosis Date Noted   Left Achilles tendinitis 12/26/2017   Degenerative arthritis of right knee 11/14/2017   Lumbar radiculopathy 01/04/2017    PCP: Ludwig Clarks, PA  REFERRING PROVIDER: Jerl Santos, MD  REFERRING DIAG: s/p right TKA  THERAPY DIAG:  Localized edema  Stiffness of right knee, not elsewhere classified  Difficulty in walking, not elsewhere classified  Acute pain of right knee  Rationale for Evaluation and Treatment: Rehabilitation  ONSET DATE: 10/22/22  SUBJECTIVE:   SUBJECTIVE STATEMENT: Doing ok, pt ambulates in with SPC  PERTINENT HISTORY: Back pain, knee pain PAIN:  Are you having pain? Yes: NPRS scale: 0/10 Pain location: right knee Pain description: ache, sore Aggravating factors: bending, walking , not taking pain meds, pain up to 9/10 Relieving factors: pain meds, elevation, pain a 4-5/10  PRECAUTIONS: None  RED FLAGS: None   WEIGHT BEARING RESTRICTIONS: No  FALLS:  Has patient fallen in last 6 months? Yes. Number of falls 1  LIVING ENVIRONMENT: Lives with: lives with their family and lives with their spouse Lives in: House/apartment Stairs: Yes: Internal: 12 steps; can reach both Has following equipment at home:  Walker - 2 wheeled  OCCUPATION: data center work, climbs ladder, lifting, computer  PLOF: Independent  PATIENT GOALS: have less pain, good motions, walk normal  NEXT MD VISIT: 11/01/22  OBJECTIVE:   DIAGNOSTIC FINDINGS: see above  PATIENT SURVEYS:  FOTO 20  COGNITION: Overall cognitive status: Within functional limits for tasks assessed     SENSATION: WFL  EDEMA:  Circumferential: right med patella 50 cm, left 42 cm  PALPATION: Swollen, tender ecchymosis mid right thigh and around the right knee, he does report a fall over the weekend, did have x-rays.  Had large gauze wrap and two ace bandages on plus the compression stocking.  LOWER EXTREMITY ROM:  Active ROM AROM Right eval PROM right eval AROM 11/03/22   Hip flexion     Hip extension     Hip abduction     Hip adduction     Hip internal rotation     Hip external rotation     Knee flexion 82 86 98  Knee extension 18 12 8   Ankle dorsiflexion     Ankle plantarflexion     Ankle inversion     Ankle eversion      (Blank rows = not tested)  LOWER EXTREMITY MMT: Not tested due to pain  LOWER EXTREMITY SPECIAL TESTS:  Knee special tests: quad lag, ballotable patella  FUNCTIONAL TESTS:  5 times sit to stand: 40 seconds Timed up and go (TUG): 32 seconds  with FWW  GAIT: Distance walked: 60 feet Assistive device utilized: Walker - 2 wheeled Level of assistance: SBA Comments: slow, step to gait pattern, antalgic on the right   TODAY'S TREATMENT:                                                                                                                              DATE:  11/03/22 NuStep L5 x 6 min LE only PROM of the right knee flexion and extension LAQ 3lb 2x10 RLE  Green tband HS curls S2S 2x10 Standing march 2x10 RLE step ups 4in x 10, then 6 in x5 Lateral step ups RLE 4in x10 Leg press 40lb 2x10   11/02/22 PROM of the right knee flexion and extension LAQ no weight working on [employer] tband  HS curls Nustep level 4 x 6 minutes Right leg HS curl with 15# 2x10 Leg press 20# 2x10 both legs, no weight working on flexion, then right leg only with no weight On airex balance beam side stepping On airex weight shift and then marching and ball toss Side stepping Vaso high pressure in elevation 35 degrees  10/29/22 NuStep L5 x 6  min R knee PROM w/ end range holds STM to R quad, patella mobs S2S x10, x5   HS curls RLE green 2x15 LAQ 2lb 3 x10 RLE  Gait SPC ~ 173ft decrease TKE  Vaso medium pressure with the right knee elevated 35 degrees F   10/27/22 NuStep L5 x 5 min R knee PROM w/ end range holds STM to R quad, patella mobs  RKE quad sets LAQ RLE 2x10 RLE HS curls red x15 Vaso medium pressure with the right knee elevated 35 degrees F   10/25/22 Vaso medium pressure with the right knee elevated 35 degrees F    PATIENT EDUCATION:  Education details: HEP/POC Person educated: Patient and Spouse Education method: Explanation, Demonstration, Actor cues, Verbal cues, and Handouts Education comprehension: verbalized understanding  HOME EXERCISE PROGRAM: Access Code: [email or web address] URL: [email or web address] Date: 10/25/2022 Prepared by: Stacie Glaze  Exercises - Seated Knee Flexion AAROM  - 2 x daily - 7 x weekly - 1 sets - 10 reps - 30 hold - Supine Short Arc Quad  - 2 x daily - 7 x weekly - 2 sets - 10 reps - 3 hold - Supine Heel Slide  - 2 x daily - 7 x weekly - 1 sets - 10 reps - 5 hold  ASSESSMENT:  CLINICAL IMPRESSION: Patient is a 67 y.o. male who was seen today for physical therapy  treatment for right TKA performed 10/21/22, has had some complications of bleeding and then a fall earlier on.  Continued with strength,  function and ROM. Cues needed throughout session to prevent compensation with functional interventions. He has progressed increasing his R knee AROM. Pt denied modality post session  OBJECTIVE IMPAIRMENTS: Abnormal gait,  cardiopulmonary status limiting activity, decreased activity tolerance, decreased balance, decreased coordination, decreased  endurance, decreased mobility, difficulty walking, decreased ROM, decreased strength, increased edema, increased muscle spasms, impaired flexibility, improper body mechanics, and pain.   REHAB POTENTIAL: Good  CLINICAL DECISION MAKING: Evolving/moderate complexity  EVALUATION COMPLEXITY: Low   GOALS: Goals reviewed with patient? Yes  SHORT TERM GOALS: Target date: 11/08/22 Independent with initial HEP Goal status: met 11/02/22  LONG TERM GOALS: Target date: 01/24/23  Independent with advanced HEP Goal status: INITIAL  2.  Increase AROM to 2-120 degrees Goal status: INITIAL  3.  Walk without assistive device Goal status: Progressing 10/29/22  4.  Go up and down stairs step over step Goal status: INITIAL  5.  Walk outside 700 feet with SPC Goal status: INITIAL   PLAN:  PT FREQUENCY: 2x/week  PT DURATION: 12 weeks  PLANNED INTERVENTIONS: Therapeutic exercises, Therapeutic activity, Neuromuscular re-education, Balance training, Gait training, Patient/Family education, Self Care, Joint mobilization, Joint manipulation, Stair training, Electrical stimulation, Cryotherapy, Taping, Vasopneumatic device, and Manual therapy  PLAN FOR NEXT SESSION: continue to advance strength, ROM and function   Grayce Sessions, PTA 11/03/2022, 11:05 AM

## 2022-11-05 ENCOUNTER — Ambulatory Visit: Payer: 59 | Admitting: Physical Therapy

## 2022-11-05 ENCOUNTER — Encounter: Payer: Self-pay | Admitting: Physical Therapy

## 2022-11-05 DIAGNOSIS — R600 Localized edema: Secondary | ICD-10-CM

## 2022-11-05 DIAGNOSIS — M25561 Pain in right knee: Secondary | ICD-10-CM | POA: Diagnosis not present

## 2022-11-05 DIAGNOSIS — M25661 Stiffness of right knee, not elsewhere classified: Secondary | ICD-10-CM

## 2022-11-05 DIAGNOSIS — R262 Difficulty in walking, not elsewhere classified: Secondary | ICD-10-CM

## 2022-11-05 NOTE — Therapy (Signed)
OUTPATIENT PHYSICAL THERAPY LOWER EXTREMITY EVALUATION   Patient Name: Derek [name] MRN: [id number] DOB:1955/10/11, 67 y.o., male Today's Date: 11/05/2022  END OF SESSION:  PT End of Session - 11/05/22 1018     Visit Number 6    Date for PT Re-Evaluation 01/24/23    PT Start Time 1018    PT Stop Time 1109    PT Time Calculation (min) 51 min    Activity Tolerance Patient tolerated treatment well    Behavior During Therapy [facility] for tasks assessed/performed             Past Medical History:  Diagnosis Date   Arthritis    knee, back, hip   Past Surgical History:  Procedure Laterality Date   COLONOSCOPY  01/15/2013   POLYPECTOMY     thumb surg  4132,4401   right thumb sx x2   Patient Active Problem List   Diagnosis Date Noted   Left Achilles tendinitis 12/26/2017   Degenerative arthritis of right knee 11/14/2017   Lumbar radiculopathy 01/04/2017    PCP: Ludwig Clarks, PA  REFERRING PROVIDER: Jerl Santos, MD  REFERRING DIAG: s/p right TKA  THERAPY DIAG:  Localized edema  Stiffness of right knee, not elsewhere classified  Difficulty in walking, not elsewhere classified  Acute pain of right knee  Rationale for Evaluation and Treatment: Rehabilitation  ONSET DATE: 10/22/22  SUBJECTIVE:   SUBJECTIVE STATEMENT: Doing pretty good, didn't sleep well last night.   PERTINENT HISTORY: Back pain, knee pain PAIN:  Are you having pain? Yes: NPRS scale: 3/10 Pain location: right knee Pain description: ache, sore Aggravating factors: bending, walking , not taking pain meds, pain up to 9/10 Relieving factors: pain meds, elevation, pain a 4-5/10  PRECAUTIONS: None  RED FLAGS: None   WEIGHT BEARING RESTRICTIONS: No  FALLS:  Has patient fallen in last 6 months? Yes. Number of falls 1  LIVING ENVIRONMENT: Lives with: lives with their family and lives with their spouse Lives in: House/apartment Stairs: Yes: Internal: 12 steps; can reach both Has following  equipment at home: Walker - 2 wheeled  OCCUPATION: data center work, climbs ladder, lifting, computer  PLOF: Independent  PATIENT GOALS: have less pain, good motions, walk normal  NEXT MD VISIT: 11/01/22  OBJECTIVE:   DIAGNOSTIC FINDINGS: see above  PATIENT SURVEYS:  FOTO 20  COGNITION: Overall cognitive status: Within functional limits for tasks assessed     SENSATION: WFL  EDEMA:  Circumferential: right med patella 50 cm, left 42 cm  PALPATION: Swollen, tender ecchymosis mid right thigh and around the right knee, he does report a fall over the weekend, did have x-rays.  Had large gauze wrap and two ace bandages on plus the compression stocking.  LOWER EXTREMITY ROM:  Active ROM AROM Right eval PROM right eval AROM 11/03/22   Hip flexion     Hip extension     Hip abduction     Hip adduction     Hip internal rotation     Hip external rotation     Knee flexion 82 86 98  Knee extension 18 12 8   Ankle dorsiflexion     Ankle plantarflexion     Ankle inversion     Ankle eversion      (Blank rows = not tested)  LOWER EXTREMITY MMT: Not tested due to pain  LOWER EXTREMITY SPECIAL TESTS:  Knee special tests: quad lag, ballotable patella  FUNCTIONAL TESTS:  5 times sit to stand: 40 seconds Timed up and go (TUG):  32 seconds with FWW  GAIT: Distance walked: 60 feet Assistive device utilized: Walker - 2 wheeled Level of assistance: SBA Comments: slow, step to gait pattern, antalgic on the right   TODAY'S TREATMENT:                                                                                                                              DATE:  11/05/22 NuStep LE only L5 x 5 mn Bike L 2 x 4 min partial revolutions  Leg press 40lb 2x10 Hamstring curls 25lb 2x10, RLE 15lb 2x10 Leg Ext 10lb 2x10, RLE 5lb x5  6in step ups x 10 PROM of the right knee flexion and extension Heel raises 2x10  11/03/22 NuStep L5 x 6 min LE only PROM of the right knee flexion  and extension LAQ 3lb 2x10 RLE  Green tband HS curls S2S 2x10 Standing march 2x10 RLE step ups 4in x 10, then 6 in x5 Lateral step ups RLE 4in x10 Leg press 40lb 2x10    11/02/22 PROM of the right knee flexion and extension LAQ no weight working on [employer] tband HS curls Nustep level 4 x 6 minutes Right leg HS curl with 15# 2x10 Leg press 20# 2x10 both legs, no weight working on flexion, then right leg only with no weight On airex balance beam side stepping On airex weight shift and then marching and ball toss Side stepping Vaso high pressure in elevation 35 degrees  10/29/22 NuStep L5 x 6  min R knee PROM w/ end range holds STM to R quad, patella mobs S2S x10, x5   HS curls RLE green 2x15 LAQ 2lb 3 x10 RLE  Gait SPC ~ 160ft decrease TKE  Vaso medium pressure with the right knee elevated 35 degrees F   10/27/22 NuStep L5 x 5 min R knee PROM w/ end range holds STM to R quad, patella mobs  RKE quad sets LAQ RLE 2x10 RLE HS curls red x15 Vaso medium pressure with the right knee elevated 35 degrees F    PATIENT EDUCATION:  Education details: HEP/POC Person educated: Patient and Spouse Education method: Explanation, Demonstration, Actor cues, Verbal cues, and Handouts Education comprehension: verbalized understanding  HOME EXERCISE PROGRAM: Access Code: [email or web address] URL: [email or web address] Date: 10/25/2022 Prepared by: Stacie Glaze  Exercises - Seated Knee Flexion AAROM  - 2 x daily - 7 x weekly - 1 sets - 10 reps - 30 hold - Supine Short Arc Quad  - 2 x daily - 7 x weekly - 2 sets - 10 reps - 3 hold - Supine Heel Slide  - 2 x daily - 7 x weekly - 1 sets - 10 reps - 5 hold  ASSESSMENT:  CLINICAL IMPRESSION: Patient is a 67 y.o. male who was seen today for physical therapy  treatment for right TKA performed 10/21/22, has had some complications of bleeding and then a fall earlier on. Continued with strength function and ROM. Cues  needed  throughout session to prevent compensation with functional interventions. Overall he is doing well considering post op time. Treatment session performed without SPC.  OBJECTIVE IMPAIRMENTS: Abnormal gait, cardiopulmonary status limiting activity, decreased activity tolerance, decreased balance, decreased coordination, decreased endurance, decreased mobility, difficulty walking, decreased ROM, decreased strength, increased edema, increased muscle spasms, impaired flexibility, improper body mechanics, and pain.   REHAB POTENTIAL: Good  CLINICAL DECISION MAKING: Evolving/moderate complexity  EVALUATION COMPLEXITY: Low   GOALS: Goals reviewed with patient? Yes  SHORT TERM GOALS: Target date: 11/08/22 Independent with initial HEP Goal status: met 11/02/22  LONG TERM GOALS: Target date: 01/24/23  Independent with advanced HEP Goal status: INITIAL  2.  Increase AROM to 2-120 degrees Goal status: INITIAL  3.  Walk without assistive device Goal status: Progressing 10/29/22  4.  Go up and down stairs step over step Goal status: INITIAL  5.  Walk outside 700 feet with SPC Goal status: INITIAL   PLAN:  PT FREQUENCY: 2x/week  PT DURATION: 12 weeks  PLANNED INTERVENTIONS: Therapeutic exercises, Therapeutic activity, Neuromuscular re-education, Balance training, Gait training, Patient/Family education, Self Care, Joint mobilization, Joint manipulation, Stair training, Electrical stimulation, Cryotherapy, Taping, Vasopneumatic device, and Manual therapy  PLAN FOR NEXT SESSION: continue to advance strength, ROM and function   Grayce Sessions, PTA 11/05/2022, 11:11 AM

## 2022-11-08 ENCOUNTER — Ambulatory Visit: Payer: 59 | Admitting: Physical Therapy

## 2022-11-08 ENCOUNTER — Encounter: Payer: Self-pay | Admitting: Physical Therapy

## 2022-11-08 DIAGNOSIS — M25661 Stiffness of right knee, not elsewhere classified: Secondary | ICD-10-CM

## 2022-11-08 DIAGNOSIS — M25561 Pain in right knee: Secondary | ICD-10-CM | POA: Diagnosis not present

## 2022-11-08 DIAGNOSIS — R600 Localized edema: Secondary | ICD-10-CM

## 2022-11-08 NOTE — Therapy (Signed)
OUTPATIENT PHYSICAL THERAPY LOWER EXTREMITY EVALUATION   Patient Name: Derek [name] MRN: [id number] DOB:Oct 27, 1955, 67 y.o., male Today's Date: 11/08/2022  END OF SESSION:  PT End of Session - 11/08/22 1104     Visit Number 7    Number of Visits 13    Date for PT Re-Evaluation 01/24/23    Authorization Type [employer]    PT Start Time 1100    PT Stop Time 1147    PT Time Calculation (min) 47 min    Activity Tolerance Patient tolerated treatment well    Behavior During Therapy WFL for tasks assessed/performed             Past Medical History:  Diagnosis Date   Arthritis    knee, back, hip   Past Surgical History:  Procedure Laterality Date   COLONOSCOPY  01/15/2013   POLYPECTOMY     thumb surg  [id number]   right thumb sx x2   Patient Active Problem List   Diagnosis Date Noted   Left Achilles tendinitis 12/26/2017   Degenerative arthritis of right knee 11/14/2017   Lumbar radiculopathy 01/04/2017    PCP: Ludwig Clarks, PA  REFERRING PROVIDER: Jerl Santos, MD  REFERRING DIAG: s/p right TKA  THERAPY DIAG:  Localized edema  Stiffness of right knee, not elsewhere classified  Acute pain of right knee  Rationale for Evaluation and Treatment: Rehabilitation  ONSET DATE: 10/22/22  SUBJECTIVE:   SUBJECTIVE STATEMENT: Reports that he seems to be very stiff, stiff legged walking  PERTINENT HISTORY: Back pain, knee pain PAIN:  Are you having pain? Yes: NPRS scale: 3/10 Pain location: right knee Pain description: ache, sore Aggravating factors: bending, walking , not taking pain meds, pain up to 9/10 Relieving factors: pain meds, elevation, pain a 4-5/10  PRECAUTIONS: None  RED FLAGS: None   WEIGHT BEARING RESTRICTIONS: No  FALLS:  Has patient fallen in last 6 months? Yes. Number of falls 1  LIVING ENVIRONMENT: Lives with: lives with their family and lives with their spouse Lives in: House/apartment Stairs: Yes: Internal: 12 steps; can reach both Has  following equipment at home: Walker - 2 wheeled  OCCUPATION: data center work, climbs ladder, lifting, computer  PLOF: Independent  PATIENT GOALS: have less pain, good motions, walk normal  NEXT MD VISIT: 11/01/22  OBJECTIVE:   DIAGNOSTIC FINDINGS: see above  PATIENT SURVEYS:  FOTO 20  COGNITION: Overall cognitive status: Within functional limits for tasks assessed     SENSATION: WFL  EDEMA:  Circumferential: right med patella 50 cm, left 42 cm  PALPATION: Swollen, tender ecchymosis mid right thigh and around the right knee, he does report a fall over the weekend, did have x-rays.  Had large gauze wrap and two ace bandages on plus the compression stocking.  LOWER EXTREMITY ROM:  Active ROM AROM Right eval PROM right eval AROM 11/03/22   Hip flexion     Hip extension     Hip abduction     Hip adduction     Hip internal rotation     Hip external rotation     Knee flexion 82 86 98  Knee extension 18 12 8   Ankle dorsiflexion     Ankle plantarflexion     Ankle inversion     Ankle eversion      (Blank rows = not tested)  LOWER EXTREMITY MMT: Not tested due to pain  LOWER EXTREMITY SPECIAL TESTS:  Knee special tests: quad lag, ballotable patella  FUNCTIONAL TESTS:  5 times sit to  stand: 40 seconds Timed up and go (TUG): 32 seconds with FWW  GAIT: Distance walked: 60 feet Assistive device utilized: Walker - 2 wheeled Level of assistance: SBA Comments: slow, step to gait pattern, antalgic on the right   TODAY'S TREATMENT:                                                                                                                              DATE:  11/08/22 Passive stretch right knee  STM to the right patella and scar Nustep level 5 x 5 minutes Bike level 2 partial revs with cues to push the ROM Stairs 4" and 6" working on step over step, does well going up, can do 4" going down On airex weight shift, march, ball toss Leg press 30# 2x10 both legs,  right only 20# small ROM, then no weight working on flexion Leg curls right only 15# 2x10 Leg extension 5# eccentrics 2x5 Fast walking trying to get some natural bending at toe off  11/05/22 NuStep LE only L5 x 5 mn Bike L 2 x 4 min partial revolutions  Leg press 40lb 2x10 Hamstring curls 25lb 2x10, RLE 15lb 2x10 Leg Ext 10lb 2x10, RLE 5lb x5  6in step ups x 10 PROM of the right knee flexion and extension Heel raises 2x10  11/03/22 NuStep L5 x 6 min LE only PROM of the right knee flexion and extension LAQ 3lb 2x10 RLE  Green tband HS curls S2S 2x10 Standing march 2x10 RLE step ups 4in x 10, then 6 in x5 Lateral step ups RLE 4in x10 Leg press 40lb 2x10    11/02/22 PROM of the right knee flexion and extension LAQ no weight working on [employer] tband HS curls Nustep level 4 x 6 minutes Right leg HS curl with 15# 2x10 Leg press 20# 2x10 both legs, no weight working on flexion, then right leg only with no weight On airex balance beam side stepping On airex weight shift and then marching and ball toss Side stepping Vaso high pressure in elevation 35 degrees  10/29/22 NuStep L5 x 6  min R knee PROM w/ end range holds STM to R quad, patella mobs S2S x10, x5   HS curls RLE green 2x15 LAQ 2lb 3 x10 RLE  Gait SPC ~ 159ft decrease TKE  Vaso medium pressure with the right knee elevated 35 degrees F   10/27/22 NuStep L5 x 5 min R knee PROM w/ end range holds STM to R quad, patella mobs  RKE quad sets LAQ RLE 2x10 RLE HS curls red x15 Vaso medium pressure with the right knee elevated 35 degrees F    PATIENT EDUCATION:  Education details: HEP/POC Person educated: Patient and Spouse Education method: Explanation, Demonstration, Actor cues, Verbal cues, and Handouts Education comprehension: verbalized understanding  HOME EXERCISE PROGRAM: Access Code: [email or web address] URL: [email or web address] Date: 10/25/2022 Prepared by: Stacie Glaze  Exercises -  Seated Knee Flexion AAROM  - 2  x daily - 7 x weekly - 1 sets - 10 reps - 30 hold - Supine Short Arc Quad  - 2 x daily - 7 x weekly - 2 sets - 10 reps - 3 hold - Supine Heel Slide  - 2 x daily - 7 x weekly - 1 sets - 10 reps - 5 hold  ASSESSMENT:  CLINICAL IMPRESSION: Patient is a 67 y.o. male who was seen today for physical therapy  treatment for right TKA performed 10/21/22, has had some complications of bleeding and then a fall earlier on. Worked a little more on stairs.  Difficulty going down, and fast walking with a natural knee bend.  Tends to be hesitant and guarded with this has some pain.  OBJECTIVE IMPAIRMENTS: Abnormal gait, cardiopulmonary status limiting activity, decreased activity tolerance, decreased balance, decreased coordination, decreased endurance, decreased mobility, difficulty walking, decreased ROM, decreased strength, increased edema, increased muscle spasms, impaired flexibility, improper body mechanics, and pain.   REHAB POTENTIAL: Good  CLINICAL DECISION MAKING: Evolving/moderate complexity  EVALUATION COMPLEXITY: Low   GOALS: Goals reviewed with patient? Yes  SHORT TERM GOALS: Target date: 11/08/22 Independent with initial HEP Goal status: met 11/02/22  LONG TERM GOALS: Target date: 01/24/23  Independent with advanced HEP Goal status: INITIAL  2.  Increase AROM to 2-120 degrees Goal status: progressing 11/08/22  3.  Walk without assistive device Goal status: Progressing 10/29/22  4.  Go up and down stairs step over step Goal status: INITIAL  5.  Walk outside 700 feet with SPC Goal status:progressing 11/08/22   PLAN:  PT FREQUENCY: 2x/week  PT DURATION: 12 weeks  PLANNED INTERVENTIONS: Therapeutic exercises, Therapeutic activity, Neuromuscular re-education, Balance training, Gait training, Patient/Family education, Self Care, Joint mobilization, Joint manipulation, Stair training, Electrical stimulation, Cryotherapy, Taping, Vasopneumatic device,  and Manual therapy  PLAN FOR NEXT SESSION: continue to advance strength, ROM and function   Bora Broner W, PT 11/08/2022, 11:05 AM

## 2022-11-10 ENCOUNTER — Ambulatory Visit: Payer: 59 | Attending: Orthopaedic Surgery | Admitting: Physical Therapy

## 2022-11-10 ENCOUNTER — Encounter: Payer: Self-pay | Admitting: Physical Therapy

## 2022-11-10 DIAGNOSIS — M25661 Stiffness of right knee, not elsewhere classified: Secondary | ICD-10-CM | POA: Diagnosis present

## 2022-11-10 DIAGNOSIS — R262 Difficulty in walking, not elsewhere classified: Secondary | ICD-10-CM | POA: Diagnosis present

## 2022-11-10 DIAGNOSIS — M25561 Pain in right knee: Secondary | ICD-10-CM

## 2022-11-10 DIAGNOSIS — R600 Localized edema: Secondary | ICD-10-CM | POA: Diagnosis present

## 2022-11-10 NOTE — Therapy (Signed)
OUTPATIENT PHYSICAL THERAPY LOWER EXTREMITY EVALUATION   Patient Name: Derek [name] MRN: [id number] DOB:May 14, 1955, 67 y.o., male Today's Date: 11/10/2022  END OF SESSION:  PT End of Session - 11/10/22 1109     Visit Number 8    Date for PT Re-Evaluation 01/24/23    PT Start Time 1100    PT Stop Time 1145    PT Time Calculation (min) 45 min    Activity Tolerance Patient tolerated treatment well    Behavior During Therapy [facility] for tasks assessed/performed             Past Medical History:  Diagnosis Date   Arthritis    knee, back, hip   Past Surgical History:  Procedure Laterality Date   COLONOSCOPY  01/15/2013   POLYPECTOMY     thumb surg  [id number]   right thumb sx x2   Patient Active Problem List   Diagnosis Date Noted   Left Achilles tendinitis 12/26/2017   Degenerative arthritis of right knee 11/14/2017   Lumbar radiculopathy 01/04/2017    PCP: Ludwig Clarks, PA  REFERRING PROVIDER: Jerl Santos, MD  REFERRING DIAG: s/p right TKA  THERAPY DIAG:  Localized edema  Stiffness of right knee, not elsewhere classified  Difficulty in walking, not elsewhere classified  Acute pain of right knee  Rationale for Evaluation and Treatment: Rehabilitation  ONSET DATE: 10/22/22  SUBJECTIVE:   SUBJECTIVE STATEMENT: Walked a mile yesterday, Achy last enight just enough to wake him up when he rolled over  PERTINENT HISTORY: Back pain, knee pain PAIN:  Are you having pain? Yes: NPRS scale: 3/10 Pain location: right knee Pain description: ache, sore Aggravating factors: bending, walking , not taking pain meds, pain up to 9/10 Relieving factors: pain meds, elevation, pain a 4-5/10  PRECAUTIONS: None  RED FLAGS: None   WEIGHT BEARING RESTRICTIONS: No  FALLS:  Has patient fallen in last 6 months? Yes. Number of falls 1  LIVING ENVIRONMENT: Lives with: lives with their family and lives with their spouse Lives in: House/apartment Stairs: Yes: Internal: 12 steps;  can reach both Has following equipment at home: Walker - 2 wheeled  OCCUPATION: data center work, climbs ladder, lifting, computer  PLOF: Independent  PATIENT GOALS: have less pain, good motions, walk normal  NEXT MD VISIT: 11/01/22  OBJECTIVE:   DIAGNOSTIC FINDINGS: see above  PATIENT SURVEYS:  FOTO 20  COGNITION: Overall cognitive status: Within functional limits for tasks assessed     SENSATION: WFL  EDEMA:  Circumferential: right med patella 50 cm, left 42 cm  PALPATION: Swollen, tender ecchymosis mid right thigh and around the right knee, he does report a fall over the weekend, did have x-rays.  Had large gauze wrap and two ace bandages on plus the compression stocking.  LOWER EXTREMITY ROM:  Active ROM AROM Right eval PROM right eval AROM 11/03/22   Hip flexion     Hip extension     Hip abduction     Hip adduction     Hip internal rotation     Hip external rotation     Knee flexion 82 86 98  Knee extension 18 12 8   Ankle dorsiflexion     Ankle plantarflexion     Ankle inversion     Ankle eversion      (Blank rows = not tested)  LOWER EXTREMITY MMT: Not tested due to pain  LOWER EXTREMITY SPECIAL TESTS:  Knee special tests: quad lag, ballotable patella  FUNCTIONAL TESTS:  5 times sit to  stand: 40 seconds Timed up and go (TUG): 32 seconds with FWW  GAIT: Distance walked: 60 feet Assistive device utilized: Walker - 2 wheeled Level of assistance: SBA Comments: slow, step to gait pattern, antalgic on the right   TODAY'S TREATMENT:                                                                                                                              DATE:  11/10/22 Bike L2 partial and full revolutions x6 min 30lb resisted gait 4 way x 4 each HS curls 25lb 2x10, RLE 15lb 2x10 Leg Ext 10lb 2x10, RLE 5lb 2x10 Passive stretch right knee  STM to the right patella and scar  11/08/22 Passive stretch right knee  STM to the right patella and  scar Nustep level 5 x 5 minutes Bike level 2 partial revs with cues to push the ROM Stairs 4" and 6" working on step over step, does well going up, can do 4" going down On airex weight shift, march, ball toss Leg press 30# 2x10 both legs, right only 20# small ROM, then no weight working on flexion Leg curls right only 15# 2x10 Leg extension 5# eccentrics 2x5 Fast walking trying to get some natural bending at toe off  11/05/22 NuStep LE only L5 x 5 mn Bike L 2 x 4 min partial revolutions  Leg press 40lb 2x10 Hamstring curls 25lb 2x10, RLE 15lb 2x10 Leg Ext 10lb 2x10, RLE 5lb x5  6in step ups x 10 PROM of the right knee flexion and extension Heel raises 2x10  11/03/22 NuStep L5 x 6 min LE only PROM of the right knee flexion and extension LAQ 3lb 2x10 RLE  Green tband HS curls S2S 2x10 Standing march 2x10 RLE step ups 4in x 10, then 6 in x5 Lateral step ups RLE 4in x10 Leg press 40lb 2x10    11/02/22 PROM of the right knee flexion and extension LAQ no weight working on [employer] tband HS curls Nustep level 4 x 6 minutes Right leg HS curl with 15# 2x10 Leg press 20# 2x10 both legs, no weight working on flexion, then right leg only with no weight On airex balance beam side stepping On airex weight shift and then marching and ball toss Side stepping Vaso high pressure in elevation 35 degrees  10/29/22 NuStep L5 x 6  min R knee PROM w/ end range holds STM to R quad, patella mobs S2S x10, x5   HS curls RLE green 2x15 LAQ 2lb 3 x10 RLE  Gait SPC ~ 133ft decrease TKE  Vaso medium pressure with the right knee elevated 35 degrees F   10/27/22 NuStep L5 x 5 min R knee PROM w/ end range holds STM to R quad, patella mobs  RKE quad sets LAQ RLE 2x10 RLE HS curls red x15 Vaso medium pressure with the right knee elevated 35 degrees F    PATIENT EDUCATION:  Education details: HEP/POC Person  educated: Patient and Spouse Education method: Explanation, Demonstration, Tactile  cues, Verbal cues, and Handouts Education comprehension: verbalized understanding  HOME EXERCISE PROGRAM: Access Code: [email or web address] URL: [email or web address] Date: 10/25/2022 Prepared by: Stacie Glaze  Exercises - Seated Knee Flexion AAROM  - 2 x daily - 7 x weekly - 1 sets - 10 reps - 30 hold - Supine Short Arc Quad  - 2 x daily - 7 x weekly - 2 sets - 10 reps - 3 hold - Supine Heel Slide  - 2 x daily - 7 x weekly - 1 sets - 10 reps - 5 hold  ASSESSMENT:  CLINICAL IMPRESSION: Patient is a 67 y.o. male who was seen today for physical therapy treatment for right TKA performed 10/21/22, has had some complications of bleeding and then a fall earlier on. Added resisted gait to today's session. Some instability present with resisted side step portion. Pt able to tolerated additional reps of single leg extensions.  OBJECTIVE IMPAIRMENTS: Abnormal gait, cardiopulmonary status limiting activity, decreased activity tolerance, decreased balance, decreased coordination, decreased endurance, decreased mobility, difficulty walking, decreased ROM, decreased strength, increased edema, increased muscle spasms, impaired flexibility, improper body mechanics, and pain.   REHAB POTENTIAL: Good  CLINICAL DECISION MAKING: Evolving/moderate complexity  EVALUATION COMPLEXITY: Low   GOALS: Goals reviewed with patient? Yes  SHORT TERM GOALS: Target date: 11/08/22 Independent with initial HEP Goal status: met 11/02/22  LONG TERM GOALS: Target date: 01/24/23  Independent with advanced HEP Goal status: INITIAL  2.  Increase AROM to 2-120 degrees Goal status: progressing 11/08/22  3.  Walk without assistive device Goal status: Progressing 10/29/22  4.  Go up and down stairs step over step Goal status: INITIAL  5.  Walk outside 700 feet with SPC Goal status:progressing 11/08/22   PLAN:  PT FREQUENCY: 2x/week  PT DURATION: 12 weeks  PLANNED INTERVENTIONS: Therapeutic exercises,  Therapeutic activity, Neuromuscular re-education, Balance training, Gait training, Patient/Family education, Self Care, Joint mobilization, Joint manipulation, Stair training, Electrical stimulation, Cryotherapy, Taping, Vasopneumatic device, and Manual therapy  PLAN FOR NEXT SESSION: continue to advance strength, ROM and function   Grayce Sessions, PTA 11/10/2022, 11:09 AM

## 2022-11-12 ENCOUNTER — Encounter: Payer: Self-pay | Admitting: Physical Therapy

## 2022-11-12 ENCOUNTER — Ambulatory Visit: Payer: 59 | Admitting: Physical Therapy

## 2022-11-12 DIAGNOSIS — M25561 Pain in right knee: Secondary | ICD-10-CM

## 2022-11-12 DIAGNOSIS — R600 Localized edema: Secondary | ICD-10-CM

## 2022-11-12 DIAGNOSIS — M25661 Stiffness of right knee, not elsewhere classified: Secondary | ICD-10-CM

## 2022-11-12 DIAGNOSIS — R262 Difficulty in walking, not elsewhere classified: Secondary | ICD-10-CM

## 2022-11-12 NOTE — Therapy (Signed)
OUTPATIENT PHYSICAL THERAPY LOWER EXTREMITY EVALUATION   Patient Name: Derek [name] MRN: [id number] DOB:07-11-1955, 67 y.o., male Today's Date: 11/12/2022  END OF SESSION:  PT End of Session - 11/12/22 1103     Visit Number 9    Date for PT Re-Evaluation 01/24/23    PT Start Time 1100    PT Stop Time 1145    PT Time Calculation (min) 45 min    Activity Tolerance Patient tolerated treatment well    Behavior During Therapy [facility] for tasks assessed/performed             Past Medical History:  Diagnosis Date   Arthritis    knee, back, hip   Past Surgical History:  Procedure Laterality Date   COLONOSCOPY  01/15/2013   POLYPECTOMY     thumb surg  4098,[id number]   right thumb sx x2   Patient Active Problem List   Diagnosis Date Noted   Left Achilles tendinitis 12/26/2017   Degenerative arthritis of right knee 11/14/2017   Lumbar radiculopathy 01/04/2017    PCP: Ludwig Clarks, PA  REFERRING PROVIDER: Jerl Santos, MD  REFERRING DIAG: s/p right TKA  THERAPY DIAG:  Localized edema  Difficulty in walking, not elsewhere classified  Stiffness of right knee, not elsewhere classified  Acute pain of right knee  Rationale for Evaluation and Treatment: Rehabilitation  ONSET DATE: 10/22/22  SUBJECTIVE:   SUBJECTIVE STATEMENT: Feeling fine, walked around the yard this morning.  PERTINENT HISTORY: Back pain, knee pain PAIN:  Are you having pain? Yes: NPRS scale: 2/10 Pain location: right knee Pain description: ache, sore Aggravating factors: bending, walking , not taking pain meds, pain up to 9/10 Relieving factors: pain meds, elevation, pain a 4-5/10  PRECAUTIONS: None  RED FLAGS: None   WEIGHT BEARING RESTRICTIONS: No  FALLS:  Has patient fallen in last 6 months? Yes. Number of falls 1  LIVING ENVIRONMENT: Lives with: lives with their family and lives with their spouse Lives in: House/apartment Stairs: Yes: Internal: 12 steps; can reach both Has following  equipment at home: Walker - 2 wheeled  OCCUPATION: data center work, climbs ladder, lifting, computer  PLOF: Independent  PATIENT GOALS: have less pain, good motions, walk normal  NEXT MD VISIT: 11/01/22  OBJECTIVE:   DIAGNOSTIC FINDINGS: see above  PATIENT SURVEYS:  FOTO 20  COGNITION: Overall cognitive status: Within functional limits for tasks assessed     SENSATION: WFL  EDEMA:  Circumferential: right med patella 50 cm, left 42 cm  PALPATION: Swollen, tender ecchymosis mid right thigh and around the right knee, he does report a fall over the weekend, did have x-rays.  Had large gauze wrap and two ace bandages on plus the compression stocking.  LOWER EXTREMITY ROM:  Active ROM AROM Right eval PROM right eval AROM 11/03/22   Hip flexion     Hip extension     Hip abduction     Hip adduction     Hip internal rotation     Hip external rotation     Knee flexion 82 86 98  Knee extension 18 12 8   Ankle dorsiflexion     Ankle plantarflexion     Ankle inversion     Ankle eversion      (Blank rows = not tested)  LOWER EXTREMITY MMT: Not tested due to pain  LOWER EXTREMITY SPECIAL TESTS:  Knee special tests: quad lag, ballotable patella  FUNCTIONAL TESTS:  5 times sit to stand: 40 seconds Timed up and go (TUG): 32  seconds with FWW  GAIT: Distance walked: 60 feet Assistive device utilized: Walker - 2 wheeled Level of assistance: SBA Comments: slow, step to gait pattern, antalgic on the right   TODAY'S TREATMENT:                                                                                                                              DATE:  11/12/22 NuStep L 5 x 5 min  Bike L2 partial and full revolutions x4 min 40lb resisted gait 4 way x 4 each 4in step downs 2 rails 2x10 S2S holding blue ball 2x12 HS curls 25lb 2x10, RLE 15lb 2x10 Leg Ext 10lb 2x10, RLE 5lb 2x10  11/10/22 Bike L2 partial and full revolutions x6 min 30lb resisted gait 4 way x 4  each HS curls 25lb 2x10, RLE 15lb 2x10 Leg Ext 10lb 2x10, RLE 5lb 2x10 Passive stretch right knee  STM to the right patella and scar  11/08/22 Passive stretch right knee  STM to the right patella and scar Nustep level 5 x 5 minutes Bike level 2 partial revs with cues to push the ROM Stairs 4" and 6" working on step over step, does well going up, can do 4" going down On airex weight shift, march, ball toss Leg press 30# 2x10 both legs, right only 20# small ROM, then no weight working on flexion Leg curls right only 15# 2x10 Leg extension 5# eccentrics 2x5 Fast walking trying to get some natural bending at toe off  11/05/22 NuStep LE only L5 x 5 mn Bike L 2 x 4 min partial revolutions  Leg press 40lb 2x10 Hamstring curls 25lb 2x10, RLE 15lb 2x10 Leg Ext 10lb 2x10, RLE 5lb x5  6in step ups x 10 PROM of the right knee flexion and extension Heel raises 2x10  11/03/22 NuStep L5 x 6 min LE only PROM of the right knee flexion and extension LAQ 3lb 2x10 RLE  Green tband HS curls S2S 2x10 Standing march 2x10 RLE step ups 4in x 10, then 6 in x5 Lateral step ups RLE 4in x10 Leg press 40lb 2x10    11/02/22 PROM of the right knee flexion and extension LAQ no weight working on [employer] tband HS curls Nustep level 4 x 6 minutes Right leg HS curl with 15# 2x10 Leg press 20# 2x10 both legs, no weight working on flexion, then right leg only with no weight On airex balance beam side stepping On airex weight shift and then marching and ball toss Side stepping Vaso high pressure in elevation 35 degrees  10/29/22 NuStep L5 x 6  min R knee PROM w/ end range holds STM to R quad, patella mobs S2S x10, x5   HS curls RLE green 2x15 LAQ 2lb 3 x10 RLE  Gait SPC ~ 144ft decrease TKE  Vaso medium pressure with the right knee elevated 35 degrees F   10/27/22 NuStep L5 x 5 min R knee PROM w/ end  range holds STM to R quad, patella mobs  RKE quad sets LAQ RLE 2x10 RLE HS curls red  x15 Vaso medium pressure with the right knee elevated 35 degrees F    PATIENT EDUCATION:  Education details: HEP/POC Person educated: Patient and Spouse Education method: Explanation, Demonstration, Actor cues, Verbal cues, and Handouts Education comprehension: verbalized understanding  HOME EXERCISE PROGRAM: Access Code: [email or web address] URL: [email or web address] Date: 10/25/2022 Prepared by: Stacie Glaze  Exercises - Seated Knee Flexion AAROM  - 2 x daily - 7 x weekly - 1 sets - 10 reps - 30 hold - Supine Short Arc Quad  - 2 x daily - 7 x weekly - 2 sets - 10 reps - 3 hold - Supine Heel Slide  - 2 x daily - 7 x weekly - 1 sets - 10 reps - 5 hold  ASSESSMENT:  CLINICAL IMPRESSION: Patient is a 67 y.o. male who was seen today for physical therapy treatment for right TKA performed 10/21/22, has had some complications of bleeding and then a fall earlier on. Added resisted gait to today's session. Pt demo ed better stability with side steps. R knee pain reported with step downs. Fatigue noted with sit to stands with mild compensation.  OBJECTIVE IMPAIRMENTS: Abnormal gait, cardiopulmonary status limiting activity, decreased activity tolerance, decreased balance, decreased coordination, decreased endurance, decreased mobility, difficulty walking, decreased ROM, decreased strength, increased edema, increased muscle spasms, impaired flexibility, improper body mechanics, and pain.   REHAB POTENTIAL: Good  CLINICAL DECISION MAKING: Evolving/moderate complexity  EVALUATION COMPLEXITY: Low   GOALS: Goals reviewed with patient? Yes  SHORT TERM GOALS: Target date: 11/08/22 Independent with initial HEP Goal status: met 11/02/22  LONG TERM GOALS: Target date: 01/24/23  Independent with advanced HEP Goal status: INITIAL  2.  Increase AROM to 2-120 degrees Goal status: progressing 11/08/22  3.  Walk without assistive device Goal status: Progressing 10/29/22  4.  Go up and  down stairs step over step Goal status: INITIAL  5.  Walk outside 700 feet with SPC Goal status:progressing 11/08/22   PLAN:  PT FREQUENCY: 2x/week  PT DURATION: 12 weeks  PLANNED INTERVENTIONS: Therapeutic exercises, Therapeutic activity, Neuromuscular re-education, Balance training, Gait training, Patient/Family education, Self Care, Joint mobilization, Joint manipulation, Stair training, Electrical stimulation, Cryotherapy, Taping, Vasopneumatic device, and Manual therapy  PLAN FOR NEXT SESSION: continue to advance strength, ROM and function   Grayce Sessions, PTA 11/12/2022, 11:05 AM

## 2022-11-17 ENCOUNTER — Ambulatory Visit: Payer: 59 | Admitting: Physical Therapy

## 2022-11-17 ENCOUNTER — Encounter: Payer: Self-pay | Admitting: Physical Therapy

## 2022-11-17 DIAGNOSIS — R600 Localized edema: Secondary | ICD-10-CM | POA: Diagnosis not present

## 2022-11-17 DIAGNOSIS — M25661 Stiffness of right knee, not elsewhere classified: Secondary | ICD-10-CM

## 2022-11-17 DIAGNOSIS — M25561 Pain in right knee: Secondary | ICD-10-CM

## 2022-11-17 DIAGNOSIS — R262 Difficulty in walking, not elsewhere classified: Secondary | ICD-10-CM

## 2022-11-17 NOTE — Therapy (Signed)
OUTPATIENT PHYSICAL THERAPY LOWER EXTREMITY TREATMENT Progress Note Reporting Period 10/25/22 to 11/17/22  See note below for Objective Data and Assessment of Progress/Goals.      Patient Name: Derek [name] MRN: [id number] DOB:04-Apr-1955, 67 y.o., male Today's Date: 11/17/2022  END OF SESSION:  PT End of Session - 11/17/22 1435     Visit Number 10    Date for PT Re-Evaluation 01/24/23    PT Start Time 1435    PT Stop Time 1515    PT Time Calculation (min) 40 min    Activity Tolerance Patient tolerated treatment well    Behavior During Therapy [facility] for tasks assessed/performed             Past Medical History:  Diagnosis Date   Arthritis    knee, back, hip   Past Surgical History:  Procedure Laterality Date   COLONOSCOPY  01/15/2013   POLYPECTOMY     thumb surg  4098,1191   right thumb sx x2   Patient Active Problem List   Diagnosis Date Noted   Left Achilles tendinitis 12/26/2017   Degenerative arthritis of right knee 11/14/2017   Lumbar radiculopathy 01/04/2017    PCP: Ludwig Clarks, PA  REFERRING PROVIDER: Jerl Santos, MD  REFERRING DIAG: s/p right TKA  THERAPY DIAG:  Localized edema  Difficulty in walking, not elsewhere classified  Stiffness of right knee, not elsewhere classified  Acute pain of right knee  Rationale for Evaluation and Treatment: Rehabilitation  ONSET DATE: 10/22/22  SUBJECTIVE:   SUBJECTIVE STATEMENT: Good a little bit of soreness  PERTINENT HISTORY: Back pain, knee pain PAIN:  Are you having pain? Yes: NPRS scale: 2/10 Pain location: right knee Pain description: ache, sore Aggravating factors: bending, walking , not taking pain meds, pain up to 9/10 Relieving factors: pain meds, elevation, pain a 4-5/10  PRECAUTIONS: None  RED FLAGS: None   WEIGHT BEARING RESTRICTIONS: No  FALLS:  Has patient fallen in last 6 months? Yes. Number of falls 1  LIVING ENVIRONMENT: Lives with: lives with their family and lives with their  spouse Lives in: House/apartment Stairs: Yes: Internal: 12 steps; can reach both Has following equipment at home: Walker - 2 wheeled  OCCUPATION: data center work, climbs ladder, lifting, computer  PLOF: Independent  PATIENT GOALS: have less pain, good motions, walk normal  NEXT MD VISIT: 11/01/22  OBJECTIVE:   DIAGNOSTIC FINDINGS: see above  PATIENT SURVEYS:  FOTO 20  COGNITION: Overall cognitive status: Within functional limits for tasks assessed     SENSATION: WFL  EDEMA:  Circumferential: right med patella 50 cm, left 42 cm  PALPATION: Swollen, tender ecchymosis mid right thigh and around the right knee, he does report a fall over the weekend, did have x-rays.  Had large gauze wrap and two ace bandages on plus the compression stocking.  LOWER EXTREMITY ROM:  Active ROM AROM Right eval PROM right eval AROM 11/03/22  AROM / PROM 11/17/22  Hip flexion      Hip extension      Hip abduction      Hip adduction      Hip internal rotation      Hip external rotation      Knee flexion 82 86 98 100/106  Knee extension 18 12 8 5   Ankle dorsiflexion      Ankle plantarflexion      Ankle inversion      Ankle eversion       (Blank rows = not tested)  LOWER EXTREMITY MMT:  Not tested due to pain  LOWER EXTREMITY SPECIAL TESTS:  Knee special tests: quad lag, ballotable patella  FUNCTIONAL TESTS:  5 times sit to stand: 40 seconds Timed up and go (TUG): 32 seconds with FWW  GAIT: Distance walked: 60 feet Assistive device utilized: Walker - 2 wheeled Level of assistance: SBA Comments: slow, step to gait pattern, antalgic on the right   TODAY'S TREATMENT:                                                                                                                              DATE:  11/17/22 Bike L 4 x6 min partial and full revolutions  Gait to back building, 2 flights of stairs, Pain when descending w/ RLE, Gait out the back building up hill HS curls 35lb 2x10,  RLE 15lb 2x10 Leg Ext 15lb 2x10, RLE 5lb 2x10  11/12/22 NuStep L 5 x 5 min  Bike L2 partial and full revolutions x4 min 40lb resisted gait 4 way x 4 each 4in step downs 2 rails 2x10 S2S holding blue ball 2x12 HS curls 25lb 2x10, RLE 15lb 2x10 Leg Ext 10lb 2x10, RLE 5lb 2x10  11/10/22 Bike L2 partial and full revolutions x6 min 30lb resisted gait 4 way x 4 each HS curls 25lb 2x10, RLE 15lb 2x10 Leg Ext 10lb 2x10, RLE 5lb 2x10 Passive stretch right knee  STM to the right patella and scar  11/08/22 Passive stretch right knee  STM to the right patella and scar Nustep level 5 x 5 minutes Bike level 2 partial revs with cues to push the ROM Stairs 4" and 6" working on step over step, does well going up, can do 4" going down On airex weight shift, march, ball toss Leg press 30# 2x10 both legs, right only 20# small ROM, then no weight working on flexion Leg curls right only 15# 2x10 Leg extension 5# eccentrics 2x5 Fast walking trying to get some natural bending at toe off  11/05/22 NuStep LE only L5 x 5 mn Bike L 2 x 4 min partial revolutions  Leg press 40lb 2x10 Hamstring curls 25lb 2x10, RLE 15lb 2x10 Leg Ext 10lb 2x10, RLE 5lb x5  6in step ups x 10 PROM of the right knee flexion and extension Heel raises 2x10  11/03/22 NuStep L5 x 6 min LE only PROM of the right knee flexion and extension LAQ 3lb 2x10 RLE  Green tband HS curls S2S 2x10 Standing march 2x10 RLE step ups 4in x 10, then 6 in x5 Lateral step ups RLE 4in x10 Leg press 40lb 2x10    11/02/22 PROM of the right knee flexion and extension LAQ no weight working on [employer] tband HS curls Nustep level 4 x 6 minutes Right leg HS curl with 15# 2x10 Leg press 20# 2x10 both legs, no weight working on flexion, then right leg only with no weight On airex balance beam side stepping On airex weight shift and then  marching and ball toss Side stepping Vaso high pressure in elevation 35 degrees  10/29/22 NuStep L5 x 6   min R knee PROM w/ end range holds STM to R quad, patella mobs S2S x10, x5   HS curls RLE green 2x15 LAQ 2lb 3 x10 RLE  Gait SPC ~ 144ft decrease TKE  Vaso medium pressure with the right knee elevated 35 degrees F   10/27/22 NuStep L5 x 5 min R knee PROM w/ end range holds STM to R quad, patella mobs  RKE quad sets LAQ RLE 2x10 RLE HS curls red x15 Vaso medium pressure with the right knee elevated 35 degrees F    PATIENT EDUCATION:  Education details: HEP/POC Person educated: Patient and Spouse Education method: Explanation, Demonstration, Actor cues, Verbal cues, and Handouts Education comprehension: verbalized understanding  HOME EXERCISE PROGRAM: Access Code: [email or web address] URL: [email or web address] Date: 10/25/2022 Prepared by: Stacie Glaze  Exercises - Seated Knee Flexion AAROM  - 2 x daily - 7 x weekly - 1 sets - 10 reps - 30 hold - Supine Short Arc Quad  - 2 x daily - 7 x weekly - 2 sets - 10 reps - 3 hold - Supine Heel Slide  - 2 x daily - 7 x weekly - 1 sets - 10 reps - 5 hold  ASSESSMENT:  CLINICAL IMPRESSION: Patient is a 67 y.o. male who was seen today for physical therapy treatment for right TKA performed 10/21/22, has had some complications of bleeding and then a fall earlier on. Pt has progressed increasing his R knee AROM in both directions. Session consisted of stair negotiation and outdoor ambulation. CGA needed when descending stairs initially due to R knee pain and discomfort. Cue needed to maintain alternating patten as well. Pt did well with outdoor gait up slope.  OBJECTIVE IMPAIRMENTS: Abnormal gait, cardiopulmonary status limiting activity, decreased activity tolerance, decreased balance, decreased coordination, decreased endurance, decreased mobility, difficulty walking, decreased ROM, decreased strength, increased edema, increased muscle spasms, impaired flexibility, improper body mechanics, and pain.   REHAB POTENTIAL:  Good  CLINICAL DECISION MAKING: Evolving/moderate complexity  EVALUATION COMPLEXITY: Low   GOALS: Goals reviewed with patient? Yes  SHORT TERM GOALS: Target date: 11/08/22 Independent with initial HEP Goal status: met 11/02/22  LONG TERM GOALS: Target date: 01/24/23  Independent with advanced HEP Goal status: INITIAL  2.  Increase AROM to 2-120 degrees Goal status: progressing 11/17/22  3.  Walk without assistive device Goal status: Progressing 11/17/22 only uses SPC outside  4.  Go up and down stairs step over step Goal status: Progressing 11/17/22  5.  Walk outside 700 feet with SPC Goal status: Met 11/17/22   PLAN:  PT FREQUENCY: 2x/week  PT DURATION: 12 weeks  PLANNED INTERVENTIONS: Therapeutic exercises, Therapeutic activity, Neuromuscular re-education, Balance training, Gait training, Patient/Family education, Self Care, Joint mobilization, Joint manipulation, Stair training, Electrical stimulation, Cryotherapy, Taping, Vasopneumatic device, and Manual therapy  PLAN FOR NEXT SESSION: continue to advance strength, ROM and function   Grayce Sessions, PTA 11/17/2022, 2:37 PM

## 2022-11-19 ENCOUNTER — Encounter: Payer: Self-pay | Admitting: Physical Therapy

## 2022-11-19 ENCOUNTER — Ambulatory Visit: Payer: 59 | Admitting: Physical Therapy

## 2022-11-19 DIAGNOSIS — M25661 Stiffness of right knee, not elsewhere classified: Secondary | ICD-10-CM

## 2022-11-19 DIAGNOSIS — R600 Localized edema: Secondary | ICD-10-CM

## 2022-11-19 DIAGNOSIS — R262 Difficulty in walking, not elsewhere classified: Secondary | ICD-10-CM

## 2022-11-19 DIAGNOSIS — M25561 Pain in right knee: Secondary | ICD-10-CM

## 2022-11-19 NOTE — Therapy (Signed)
OUTPATIENT PHYSICAL THERAPY LOWER EXTREMITY TREATMENT Progress Note Reporting Period 10/25/22 to 11/17/22  See note below for Objective Data and Assessment of Progress/Goals.      Patient Name: Derek [name] MRN: [id number] DOB:1955-07-10, 67 y.o., male Today's Date: 11/19/2022  END OF SESSION:  PT End of Session - 11/19/22 0759     Visit Number 11    Date for PT Re-Evaluation 01/24/23    PT Start Time 0800    PT Stop Time 0845    PT Time Calculation (min) 45 min    Activity Tolerance Patient tolerated treatment well    Behavior During Therapy [facility] for tasks assessed/performed             Past Medical History:  Diagnosis Date   Arthritis    knee, back, hip   Past Surgical History:  Procedure Laterality Date   COLONOSCOPY  01/15/2013   POLYPECTOMY     thumb surg  [id number]   right thumb sx x2   Patient Active Problem List   Diagnosis Date Noted   Left Achilles tendinitis 12/26/2017   Degenerative arthritis of right knee 11/14/2017   Lumbar radiculopathy 01/04/2017    PCP: Ludwig Clarks, PA  REFERRING PROVIDER: Jerl Santos, MD  REFERRING DIAG: s/p right TKA  THERAPY DIAG:  Localized edema  Difficulty in walking, not elsewhere classified  Stiffness of right knee, not elsewhere classified  Acute pain of right knee  Rationale for Evaluation and Treatment: Rehabilitation  ONSET DATE: 10/22/22  SUBJECTIVE:   SUBJECTIVE STATEMENT: "Not bad"  PERTINENT HISTORY: Back pain, knee pain PAIN:  Are you having pain? Yes: NPRS scale: 2/10 Pain location: right knee Pain description: Tightness Aggravating factors: bending, walking , not taking pain meds, pain up to 9/10 Relieving factors: pain meds, elevation, pain a 4-5/10  PRECAUTIONS: None  RED FLAGS: None   WEIGHT BEARING RESTRICTIONS: No  FALLS:  Has patient fallen in last 6 months? Yes. Number of falls 1  LIVING ENVIRONMENT: Lives with: lives with their family and lives with their spouse Lives in:  House/apartment Stairs: Yes: Internal: 12 steps; can reach both Has following equipment at home: Walker - 2 wheeled  OCCUPATION: data center work, climbs ladder, lifting, computer  PLOF: Independent  PATIENT GOALS: have less pain, good motions, walk normal  NEXT MD VISIT: 11/01/22  OBJECTIVE:   DIAGNOSTIC FINDINGS: see above  PATIENT SURVEYS:  FOTO 20  COGNITION: Overall cognitive status: Within functional limits for tasks assessed     SENSATION: WFL  EDEMA:  Circumferential: right med patella 50 cm, left 42 cm  PALPATION: Swollen, tender ecchymosis mid right thigh and around the right knee, he does report a fall over the weekend, did have x-rays.  Had large gauze wrap and two ace bandages on plus the compression stocking.  LOWER EXTREMITY ROM:  Active ROM AROM Right eval PROM right eval AROM 11/03/22  AROM / PROM 11/17/22  Hip flexion      Hip extension      Hip abduction      Hip adduction      Hip internal rotation      Hip external rotation      Knee flexion 82 86 98 100/106  Knee extension 18 12 8 5   Ankle dorsiflexion      Ankle plantarflexion      Ankle inversion      Ankle eversion       (Blank rows = not tested)  LOWER EXTREMITY MMT: Not tested due to pain  LOWER EXTREMITY SPECIAL TESTS:  Knee special tests: quad lag, ballotable patella  FUNCTIONAL TESTS:  5 times sit to stand: 40 seconds Timed up and go (TUG): 32 seconds with FWW  GAIT: Distance walked: 60 feet Assistive device utilized: Walker - 2 wheeled Level of assistance: SBA Comments: slow, step to gait pattern, antalgic on the right   TODAY'S TREATMENT:                                                                                                                              DATE:  11/19/22 Bike L 4 x6 min partial and full revolutions  40lb Resisted gait 4 way x 4 each 4in Lateral eccentric step downs RLE 2x10 6in lateral step ups x10  6in box on airex step ups x10 each HS  curls 20lb RLE 2x12  Leg Ext RLE 5lb 2x12  11/17/22 Bike L 4 x6 min partial and full revolutions  Gait to back building, 2 flights of stairs, Pain when descending w/ RLE, Gait out the back building up hill HS curls 35lb 2x10, RLE 15lb 2x10 Leg Ext 15lb 2x10, RLE 5lb 2x10  11/12/22 NuStep L 5 x 5 min  Bike L2 partial and full revolutions x4 min 40lb resisted gait 4 way x 4 each 4in step downs 2 rails 2x10 S2S holding blue ball 2x12 HS curls 25lb 2x10, RLE 15lb 2x10 Leg Ext 10lb 2x10, RLE 5lb 2x10  11/10/22 Bike L2 partial and full revolutions x6 min 30lb resisted gait 4 way x 4 each HS curls 25lb 2x10, RLE 15lb 2x10 Leg Ext 10lb 2x10, RLE 5lb 2x10 Passive stretch right knee  STM to the right patella and scar  11/08/22 Passive stretch right knee  STM to the right patella and scar Nustep level 5 x 5 minutes Bike level 2 partial revs with cues to push the ROM Stairs 4" and 6" working on step over step, does well going up, can do 4" going down On airex weight shift, march, ball toss Leg press 30# 2x10 both legs, right only 20# small ROM, then no weight working on flexion Leg curls right only 15# 2x10 Leg extension 5# eccentrics 2x5 Fast walking trying to get some natural bending at toe off  11/05/22 NuStep LE only L5 x 5 mn Bike L 2 x 4 min partial revolutions  Leg press 40lb 2x10 Hamstring curls 25lb 2x10, RLE 15lb 2x10 Leg Ext 10lb 2x10, RLE 5lb x5  6in step ups x 10 PROM of the right knee flexion and extension Heel raises 2x10  11/03/22 NuStep L5 x 6 min LE only PROM of the right knee flexion and extension LAQ 3lb 2x10 RLE  Green tband HS curls S2S 2x10 Standing march 2x10 RLE step ups 4in x 10, then 6 in x5 Lateral step ups RLE 4in x10 Leg press 40lb 2x10    11/02/22 PROM of the right knee flexion and extension LAQ no weight working on [employer]  tband HS curls Nustep level 4 x 6 minutes Right leg HS curl with 15# 2x10 Leg press 20# 2x10 both legs, no weight  working on flexion, then right leg only with no weight On airex balance beam side stepping On airex weight shift and then marching and ball toss Side stepping Vaso high pressure in elevation 35 degrees  10/29/22 NuStep L5 x 6  min R knee PROM w/ end range holds STM to R quad, patella mobs S2S x10, x5   HS curls RLE green 2x15 LAQ 2lb 3 x10 RLE  Gait SPC ~ 174ft decrease TKE  Vaso medium pressure with the right knee elevated 35 degrees F   10/27/22 NuStep L5 x 5 min R knee PROM w/ end range holds STM to R quad, patella mobs  RKE quad sets LAQ RLE 2x10 RLE HS curls red x15 Vaso medium pressure with the right knee elevated 35 degrees F    PATIENT EDUCATION:  Education details: HEP/POC Person educated: Patient and Spouse Education method: Explanation, Demonstration, Actor cues, Verbal cues, and Handouts Education comprehension: verbalized understanding  HOME EXERCISE PROGRAM: Access Code: [email or web address] URL: [email or web address] Date: 10/25/2022 Prepared by: Stacie Glaze  Exercises - Seated Knee Flexion AAROM  - 2 x daily - 7 x weekly - 1 sets - 10 reps - 30 hold - Supine Short Arc Quad  - 2 x daily - 7 x weekly - 2 sets - 10 reps - 3 hold - Supine Heel Slide  - 2 x daily - 7 x weekly - 1 sets - 10 reps - 5 hold  ASSESSMENT:  CLINICAL IMPRESSION: Patient is a 67 y.o. male who was seen today for physical therapy treatment for right TKA performed 10/21/22, has had some complications of bleeding and then a fall earlier on. Increase resistance tolerated with resisted gait. Some pain and discomfort with eccentric step downs, this also exposed some weakness. Cue to hold contraction with leg extensions.Some instability present with step usp sing airex. Good effort given overall.  OBJECTIVE IMPAIRMENTS: Abnormal gait, cardiopulmonary status limiting activity, decreased activity tolerance, decreased balance, decreased coordination, decreased endurance, decreased  mobility, difficulty walking, decreased ROM, decreased strength, increased edema, increased muscle spasms, impaired flexibility, improper body mechanics, and pain.   REHAB POTENTIAL: Good  CLINICAL DECISION MAKING: Evolving/moderate complexity  EVALUATION COMPLEXITY: Low   GOALS: Goals reviewed with patient? Yes  SHORT TERM GOALS: Target date: 11/08/22 Independent with initial HEP Goal status: met 11/02/22  LONG TERM GOALS: Target date: 01/24/23  Independent with advanced HEP Goal status: INITIAL  2.  Increase AROM to 2-120 degrees Goal status: progressing 11/17/22  3.  Walk without assistive device Goal status: Progressing 11/17/22 only uses SPC outside  4.  Go up and down stairs step over step Goal status: Progressing 11/17/22  5.  Walk outside 700 feet with SPC Goal status: Met 11/17/22   PLAN:  PT FREQUENCY: 2x/week  PT DURATION: 12 weeks  PLANNED INTERVENTIONS: Therapeutic exercises, Therapeutic activity, Neuromuscular re-education, Balance training, Gait training, Patient/Family education, Self Care, Joint mobilization, Joint manipulation, Stair training, Electrical stimulation, Cryotherapy, Taping, Vasopneumatic device, and Manual therapy  PLAN FOR NEXT SESSION: continue to advance strength, ROM and function   Grayce Sessions, PTA 11/19/2022, 7:59 AM

## 2022-11-22 ENCOUNTER — Encounter: Payer: Self-pay | Admitting: Physical Therapy

## 2022-11-22 ENCOUNTER — Ambulatory Visit: Payer: 59 | Admitting: Physical Therapy

## 2022-11-22 DIAGNOSIS — R600 Localized edema: Secondary | ICD-10-CM | POA: Diagnosis not present

## 2022-11-22 DIAGNOSIS — R262 Difficulty in walking, not elsewhere classified: Secondary | ICD-10-CM

## 2022-11-22 DIAGNOSIS — M25561 Pain in right knee: Secondary | ICD-10-CM

## 2022-11-22 DIAGNOSIS — M25661 Stiffness of right knee, not elsewhere classified: Secondary | ICD-10-CM

## 2022-11-22 NOTE — Therapy (Signed)
OUTPATIENT PHYSICAL THERAPY LOWER EXTREMITY TREATMENT       Patient Name: Derek [name] MRN: [id number] DOB:Jul 17, 1955, 67 y.o., male Today's Date: 11/22/2022  END OF SESSION:  PT End of Session - 11/22/22 1431     Visit Number 12    Date for PT Re-Evaluation 01/24/23    PT Start Time 1430    PT Stop Time 1515    PT Time Calculation (min) 45 min    Activity Tolerance Patient tolerated treatment well    Behavior During Therapy [facility] for tasks assessed/performed             Past Medical History:  Diagnosis Date   Arthritis    knee, back, hip   Past Surgical History:  Procedure Laterality Date   COLONOSCOPY  01/15/2013   POLYPECTOMY     thumb surg  2841,3244   right thumb sx x2   Patient Active Problem List   Diagnosis Date Noted   Left Achilles tendinitis 12/26/2017   Degenerative arthritis of right knee 11/14/2017   Lumbar radiculopathy 01/04/2017    PCP: Ludwig Clarks, PA  REFERRING PROVIDER: Jerl Santos, MD  REFERRING DIAG: s/p right TKA  THERAPY DIAG:  Localized edema  Difficulty in walking, not elsewhere classified  Stiffness of right knee, not elsewhere classified  Acute pain of right knee  Rationale for Evaluation and Treatment: Rehabilitation  ONSET DATE: 10/22/22  SUBJECTIVE:   SUBJECTIVE STATEMENT: "I feel ok now" A little stiff after walking this morning  PERTINENT HISTORY: Back pain, knee pain PAIN:  Are you having pain? Yes: NPRS scale:  /10 Pain location: right knee Pain description: Tightness Aggravating factors: bending, walking , not taking pain meds, pain up to 9/10 Relieving factors: pain meds, elevation, pain a 4-5/10  PRECAUTIONS: None  RED FLAGS: None   WEIGHT BEARING RESTRICTIONS: No  FALLS:  Has patient fallen in last 6 months? Yes. Number of falls 1  LIVING ENVIRONMENT: Lives with: lives with their family and lives with their spouse Lives in: House/apartment Stairs: Yes: Internal: 12 steps; can reach both Has  following equipment at home: Walker - 2 wheeled  OCCUPATION: data center work, climbs ladder, lifting, computer  PLOF: Independent  PATIENT GOALS: have less pain, good motions, walk normal  NEXT MD VISIT: 11/01/22  OBJECTIVE:   DIAGNOSTIC FINDINGS: see above  PATIENT SURVEYS:  FOTO 20  COGNITION: Overall cognitive status: Within functional limits for tasks assessed     SENSATION: WFL  EDEMA:  Circumferential: right med patella 50 cm, left 42 cm  PALPATION: Swollen, tender ecchymosis mid right thigh and around the right knee, he does report a fall over the weekend, did have x-rays.  Had large gauze wrap and two ace bandages on plus the compression stocking.  LOWER EXTREMITY ROM:  Active ROM AROM Right eval PROM right eval AROM 11/03/22  AROM / PROM 11/17/22  Hip flexion      Hip extension      Hip abduction      Hip adduction      Hip internal rotation      Hip external rotation      Knee flexion 82 86 98 100/106  Knee extension 18 12 8 5   Ankle dorsiflexion      Ankle plantarflexion      Ankle inversion      Ankle eversion       (Blank rows = not tested)  LOWER EXTREMITY MMT: Not tested due to pain  LOWER EXTREMITY SPECIAL TESTS:  Knee  special tests: quad lag, ballotable patella  FUNCTIONAL TESTS:  5 times sit to stand: 40 seconds Timed up and go (TUG): 32 seconds with FWW  GAIT: Distance walked: 60 feet Assistive device utilized: Walker - 2 wheeled Level of assistance: SBA Comments: slow, step to gait pattern, antalgic on the right   TODAY'S TREATMENT:                                                                                                                              DATE:  11/22/22 NuStep L 5 x5 min Bike L 3 x4 min partial and full revolutions  40lb Resisted side steps over WaTE x8 each  Airex on 6in box forward ad lateral step ups x10 each  S2S holding blue ball on airex 2x10 RLE Eccentric lateral step downs 2x10 4in HS curls 20lb  RLE 2x10 Leg Ext RLE 5lb 2x12 R knee PROM with end range holds  11/19/22 Bike L 4 x6 min partial and full revolutions  40lb Resisted gait 4 way x 4 each 4in Lateral eccentric step downs RLE 2x10 6in lateral step ups x10  6in box on airex step ups x10 each HS curls 20lb RLE 2x12  Leg Ext RLE 5lb 2x12  11/17/22 Bike L 4 x6 min partial and full revolutions  Gait to back building, 2 flights of stairs, Pain when descending w/ RLE, Gait out the back building up hill HS curls 35lb 2x10, RLE 15lb 2x10 Leg Ext 15lb 2x10, RLE 5lb 2x10  11/12/22 NuStep L 5 x 5 min  Bike L2 partial and full revolutions x4 min 40lb resisted gait 4 way x 4 each 4in step downs 2 rails 2x10 S2S holding blue ball 2x12 HS curls 25lb 2x10, RLE 15lb 2x10 Leg Ext 10lb 2x10, RLE 5lb 2x10  11/10/22 Bike L2 partial and full revolutions x6 min 30lb resisted gait 4 way x 4 each HS curls 25lb 2x10, RLE 15lb 2x10 Leg Ext 10lb 2x10, RLE 5lb 2x10 Passive stretch right knee  STM to the right patella and scar  11/08/22 Passive stretch right knee  STM to the right patella and scar Nustep level 5 x 5 minutes Bike level 2 partial revs with cues to push the ROM Stairs 4" and 6" working on step over step, does well going up, can do 4" going down On airex weight shift, march, ball toss Leg press 30# 2x10 both legs, right only 20# small ROM, then no weight working on flexion Leg curls right only 15# 2x10 Leg extension 5# eccentrics 2x5 Fast walking trying to get some natural bending at toe off  11/05/22 NuStep LE only L5 x 5 mn Bike L 2 x 4 min partial revolutions  Leg press 40lb 2x10 Hamstring curls 25lb 2x10, RLE 15lb 2x10 Leg Ext 10lb 2x10, RLE 5lb x5  6in step ups x 10 PROM of the right knee flexion and extension Heel raises 2x10  11/03/22 NuStep L5 x 6 min  LE only PROM of the right knee flexion and extension LAQ 3lb 2x10 RLE  Green tband HS curls S2S 2x10 Standing march 2x10 RLE step ups 4in x 10, then 6 in  x5 Lateral step ups RLE 4in x10 Leg press 40lb 2x10    11/02/22 PROM of the right knee flexion and extension LAQ no weight working on [employer] tband HS curls Nustep level 4 x 6 minutes Right leg HS curl with 15# 2x10 Leg press 20# 2x10 both legs, no weight working on flexion, then right leg only with no weight On airex balance beam side stepping On airex weight shift and then marching and ball toss Side stepping Vaso high pressure in elevation 35 degrees  10/29/22 NuStep L5 x 6  min R knee PROM w/ end range holds STM to R quad, patella mobs S2S x10, x5   HS curls RLE green 2x15 LAQ 2lb 3 x10 RLE  Gait SPC ~ 151ft decrease TKE  Vaso medium pressure with the right knee elevated 35 degrees F   10/27/22 NuStep L5 x 5 min R knee PROM w/ end range holds STM to R quad, patella mobs  RKE quad sets LAQ RLE 2x10 RLE HS curls red x15 Vaso medium pressure with the right knee elevated 35 degrees F    PATIENT EDUCATION:  Education details: HEP/POC Person educated: Patient and Spouse Education method: Explanation, Demonstration, Actor cues, Verbal cues, and Handouts Education comprehension: verbalized understanding  HOME EXERCISE PROGRAM: Access Code: [email or web address] URL: [email or web address] Date: 10/25/2022 Prepared by: Stacie Glaze  Exercises - Seated Knee Flexion AAROM  - 2 x daily - 7 x weekly - 1 sets - 10 reps - 30 hold - Supine Short Arc Quad  - 2 x daily - 7 x weekly - 2 sets - 10 reps - 3 hold - Supine Heel Slide  - 2 x daily - 7 x weekly - 1 sets - 10 reps - 5 hold  ASSESSMENT:  CLINICAL IMPRESSION: Patient is a 67 y.o. male who was seen today for physical therapy treatment for right TKA performed 10/21/22, has had some complications of bleeding and then a fall earlier on. Some hip weakness present with resisted side steps over WaTE.  Some pain and discomfort with lateral eccentric step downs, this also exposed some weakness. Cue to hold contraction  with leg extensions. Weakness and instability noted with step ups using ariex.Peri [place] effort given overall.  OBJECTIVE IMPAIRMENTS: Abnormal gait, cardiopulmonary status limiting activity, decreased activity tolerance, decreased balance, decreased coordination, decreased endurance, decreased mobility, difficulty walking, decreased ROM, decreased strength, increased edema, increased muscle spasms, impaired flexibility, improper body mechanics, and pain.   REHAB POTENTIAL: Good  CLINICAL DECISION MAKING: Evolving/moderate complexity  EVALUATION COMPLEXITY: Low   GOALS: Goals reviewed with patient? Yes  SHORT TERM GOALS: Target date: 11/08/22 Independent with initial HEP Goal status: met 11/02/22  LONG TERM GOALS: Target date: 01/24/23  Independent with advanced HEP Goal status: INITIAL  2.  Increase AROM to 2-120 degrees Goal status: progressing 11/17/22  3.  Walk without assistive device Goal status: Progressing 11/17/22 only uses SPC outside  4.  Go up and down stairs step over step Goal status: Progressing 11/17/22  5.  Walk outside 700 feet with SPC Goal status: Met 11/17/22   PLAN:  PT FREQUENCY: 2x/week  PT DURATION: 12 weeks  PLANNED INTERVENTIONS: Therapeutic exercises, Therapeutic activity, Neuromuscular re-education, Balance training, Gait training, Patient/Family education, Self Care, Joint mobilization, Joint manipulation, Stair training, Electrical stimulation,  Cryotherapy, Taping, Vasopneumatic device, and Manual therapy  PLAN FOR NEXT SESSION: continue to advance strength, ROM and function   Grayce Sessions, PTA 11/22/2022, 2:33 PM

## 2022-11-24 ENCOUNTER — Encounter: Payer: Self-pay | Admitting: Physical Therapy

## 2022-11-24 ENCOUNTER — Ambulatory Visit: Payer: 59 | Admitting: Physical Therapy

## 2022-11-24 DIAGNOSIS — M25661 Stiffness of right knee, not elsewhere classified: Secondary | ICD-10-CM

## 2022-11-24 DIAGNOSIS — R600 Localized edema: Secondary | ICD-10-CM

## 2022-11-24 DIAGNOSIS — R262 Difficulty in walking, not elsewhere classified: Secondary | ICD-10-CM

## 2022-11-24 DIAGNOSIS — M25561 Pain in right knee: Secondary | ICD-10-CM

## 2022-11-24 NOTE — Therapy (Signed)
OUTPATIENT PHYSICAL THERAPY LOWER EXTREMITY TREATMENT       Patient Name: Derek [name] MRN: [id number] DOB:[birth year], 67 y.o., male Today's Date: 11/24/2022  END OF SESSION:  PT End of Session - 11/24/22 1103     Visit Number 13    Date for PT Re-Evaluation 01/24/23    PT Start Time 1103    PT Stop Time 1145    PT Time Calculation (min) 42 min    Activity Tolerance Patient tolerated treatment well    Behavior During Therapy [facility] for tasks assessed/performed             Past Medical History:  Diagnosis Date   Arthritis    knee, back, hip   Past Surgical History:  Procedure Laterality Date   COLONOSCOPY  01/15/2013   POLYPECTOMY     thumb surg  2130,8657   right thumb sx x2   Patient Active Problem List   Diagnosis Date Noted   Left Achilles tendinitis 12/26/2017   Degenerative arthritis of right knee 11/14/2017   Lumbar radiculopathy 01/04/2017    PCP: Ludwig Clarks, PA  REFERRING PROVIDER: Jerl Santos, MD  REFERRING DIAG: s/p right TKA  THERAPY DIAG:  Localized edema  Difficulty in walking, not elsewhere classified  Stiffness of right knee, not elsewhere classified  Acute pain of right knee  Rationale for Evaluation and Treatment: Rehabilitation  ONSET DATE: 10/22/22  SUBJECTIVE:   SUBJECTIVE STATEMENT: "Ok Today"  PERTINENT HISTORY: Back pain, knee pain PAIN:  Are you having pain? Yes: NPRS scale: 0/10 Pain location: right knee Pain description: Tightness Aggravating factors: bending, walking , not taking pain meds, pain up to 9/10 Relieving factors: pain meds, elevation, pain a 4-5/10  PRECAUTIONS: None  RED FLAGS: None   WEIGHT BEARING RESTRICTIONS: No  FALLS:  Has patient fallen in last 6 months? Yes. Number of falls 1  LIVING ENVIRONMENT: Lives with: lives with their family and lives with their spouse Lives in: House/apartment Stairs: Yes: Internal: 12 steps; can reach both Has following equipment at home: Walker - 2  wheeled  OCCUPATION: data center work, climbs ladder, lifting, computer  PLOF: Independent  PATIENT GOALS: have less pain, good motions, walk normal  NEXT MD VISIT: 11/01/22  OBJECTIVE:   DIAGNOSTIC FINDINGS: see above  PATIENT SURVEYS:  FOTO 20  COGNITION: Overall cognitive status: Within functional limits for tasks assessed     SENSATION: WFL  EDEMA:  Circumferential: right med patella 50 cm, left 42 cm  PALPATION: Swollen, tender ecchymosis mid right thigh and around the right knee, he does report a fall over the weekend, did have x-rays.  Had large gauze wrap and two ace bandages on plus the compression stocking.  LOWER EXTREMITY ROM:  Active ROM AROM Right eval PROM right eval AROM 11/03/22  AROM / PROM 11/17/22  Hip flexion      Hip extension      Hip abduction      Hip adduction      Hip internal rotation      Hip external rotation      Knee flexion 82 86 98 100/106  Knee extension 18 12 8 5   Ankle dorsiflexion      Ankle plantarflexion      Ankle inversion      Ankle eversion       (Blank rows = not tested)  LOWER EXTREMITY MMT: Not tested due to pain  LOWER EXTREMITY SPECIAL TESTS:  Knee special tests: quad lag, ballotable patella  FUNCTIONAL TESTS:  5 times sit to stand: 40 seconds Timed up and go (TUG): 32 seconds with FWW  GAIT: Distance walked: 60 feet Assistive device utilized: Walker - 2 wheeled Level of assistance: SBA Comments: slow, step to gait pattern, antalgic on the right   TODAY'S TREATMENT:                                                                                                                              DATE:  11/24/22 NuStep L 5 x6 min LE only 4in anterior step downs 2x10 S2S holding blue ball on airex 2x10 Resisted gait with 6in step up 2x8 30lb  Heel raises 2x10 Leg press 60lb 2x15   11/22/22 NuStep L 5 x5 min Bike L 3 x4 min partial and full revolutions  40lb Resisted side steps over WaTE x8 each   Airex on 6in box forward ad lateral step ups x10 each  S2S holding blue ball on airex 2x10 RLE Eccentric lateral step downs 2x10 4in HS curls 20lb RLE 2x10 Leg Ext RLE 5lb 2x12 R knee PROM with end range holds  11/19/22 Bike L 4 x6 min partial and full revolutions  40lb Resisted gait 4 way x 4 each 4in Lateral eccentric step downs RLE 2x10 6in lateral step ups x10  6in box on airex step ups x10 each HS curls 20lb RLE 2x12  Leg Ext RLE 5lb 2x12  11/17/22 Bike L 4 x6 min partial and full revolutions  Gait to back building, 2 flights of stairs, Pain when descending w/ RLE, Gait out the back building up hill HS curls 35lb 2x10, RLE 15lb 2x10 Leg Ext 15lb 2x10, RLE 5lb 2x10  11/12/22 NuStep L 5 x 5 min  Bike L2 partial and full revolutions x4 min 40lb resisted gait 4 way x 4 each 4in step downs 2 rails 2x10 S2S holding blue ball 2x12 HS curls 25lb 2x10, RLE 15lb 2x10 Leg Ext 10lb 2x10, RLE 5lb 2x10  11/10/22 Bike L2 partial and full revolutions x6 min 30lb resisted gait 4 way x 4 each HS curls 25lb 2x10, RLE 15lb 2x10 Leg Ext 10lb 2x10, RLE 5lb 2x10 Passive stretch right knee  STM to the right patella and scar  11/08/22 Passive stretch right knee  STM to the right patella and scar Nustep level 5 x 5 minutes Bike level 2 partial revs with cues to push the ROM Stairs 4" and 6" working on step over step, does well going up, can do 4" going down On airex weight shift, march, ball toss Leg press 30# 2x10 both legs, right only 20# small ROM, then no weight working on flexion Leg curls right only 15# 2x10 Leg extension 5# eccentrics 2x5 Fast walking trying to get some natural bending at toe off   PATIENT EDUCATION:  Education details: HEP/POC Person educated: Patient and Spouse Education method: Explanation, Facilities manager, Actor cues, Verbal cues, and Handouts Education comprehension: verbalized understanding  HOME EXERCISE PROGRAM: Access  Code: [email or web address] URL:  [email or web address] Date: 10/25/2022 Prepared by: Stacie Glaze  Exercises - Seated Knee Flexion AAROM  - 2 x daily - 7 x weekly - 1 sets - 10 reps - 30 hold - Supine Short Arc Quad  - 2 x daily - 7 x weekly - 2 sets - 10 reps - 3 hold - Supine Heel Slide  - 2 x daily - 7 x weekly - 1 sets - 10 reps - 5 hold  ASSESSMENT:  CLINICAL IMPRESSION: Patient is a 67 y.o. male who was seen today for physical therapy treatment for right TKA performed 10/21/22, has had some complications of bleeding and then a fall earlier on. Some hip weakness present with resisted gait with step ups. RLE eccentric load weakness and knee discomfort with anterior step downs reported. Some difficulty today with sit to stands holding blue ball on airex. Good effort given overall. Sees MD friday  OBJECTIVE IMPAIRMENTS: Abnormal gait, cardiopulmonary status limiting activity, decreased activity tolerance, decreased balance, decreased coordination, decreased endurance, decreased mobility, difficulty walking, decreased ROM, decreased strength, increased edema, increased muscle spasms, impaired flexibility, improper body mechanics, and pain.   REHAB POTENTIAL: Good  CLINICAL DECISION MAKING: Evolving/moderate complexity  EVALUATION COMPLEXITY: Low   GOALS: Goals reviewed with patient? Yes  SHORT TERM GOALS: Target date: 11/08/22 Independent with initial HEP Goal status: met 11/02/22  LONG TERM GOALS: Target date: 01/24/23  Independent with advanced HEP Goal status: Met 11/24/22  2.  Increase AROM to 2-120 degrees Goal status: progressing 11/17/22  3.  Walk without assistive device Goal status: Progressing 11/17/22 only uses SPC outside  4.  Go up and down stairs step over step Goal status: Progressing 11/17/22  5.  Walk outside 700 feet with SPC Goal status: Met 11/17/22   PLAN:  PT FREQUENCY: 2x/week  PT DURATION: 12 weeks  PLANNED INTERVENTIONS: Therapeutic exercises, Therapeutic  activity, Neuromuscular re-education, Balance training, Gait training, Patient/Family education, Self Care, Joint mobilization, Joint manipulation, Stair training, Electrical stimulation, Cryotherapy, Taping, Vasopneumatic device, and Manual therapy  PLAN FOR NEXT SESSION: continue to advance strength, ROM and function   Grayce Sessions, PTA 11/24/2022, 11:04 AM

## 2023-11-01 ENCOUNTER — Ambulatory Visit
Admission: RE | Admit: 2023-11-01 | Discharge: 2023-11-01 | Disposition: A | Discharge status: Home / Self Care | Source: Ambulatory Visit

## 2023-11-01 VITALS — BP 152/86 | HR 73 | Temp 98.5°F | Resp 16

## 2023-11-01 DIAGNOSIS — S39012A Strain of muscle, fascia and tendon of lower back, initial encounter: Secondary | ICD-10-CM

## 2023-11-01 MED ORDER — DICLOFENAC SODIUM 50 MG PO TBEC: 50.0000 mg | DELAYED_RELEASE_TABLET | Freq: Two times a day (BID) | ORAL | 1 refills | Status: AC

## 2023-11-01 MED ORDER — BACLOFEN 10 MG PO TABS: 10.0000 mg | ORAL_TABLET | Freq: Three times a day (TID) | ORAL | 0 refills | Status: AC

## 2023-11-01 NOTE — ED Triage Notes (Signed)
 Pt c/o left lower back pain since Monday. States it caught while coming up stairs and has been hurting since.

## 2023-11-01 NOTE — ED Provider Notes (Signed)
 [facility]  Note:  This document was prepared using Dragon voice recognition software and may include unintentional dictation errors.  MRN: [id number] DOB: [birth year]  Subjective:   Derek [name] is a 68 y.o. male presenting for left back pain since yesterday.  Patient reports he was helping someone yard lifting objects and riding on machinery while aerating the lawn.  Patient reports he did not have any pain or any specific injury during these activities, however the next morning he felt stiff and had pain in the knee.  Patient denies any spinal pain and no radiculopathy to the left leg.  Patient reports he did take Tylenol but had no relief.  Patient has past history of lower back pain with neuropathy, however symptoms are usually on the right leg.  Patient has currently using a TENS unit at home muscle spasm to bilateral lower back with some improvement to symptoms.  No current facility-administered medications for this encounter.  Current Outpatient Medications:    baclofen  (LIORESAL ) 10 MG tablet, Take 1 tablet (10 mg total) by mouth 3 (three) times daily., Disp: 30 each, Rfl: 0   diclofenac  (VOLTAREN ) 50 MG EC tablet, Take 1 tablet (50 mg total) by mouth 2 (two) times daily., Disp: 30 tablet, Rfl: 1   atorvastatin (LIPITOR) 10 MG tablet, Take 10 mg by mouth 3 (three) times a week., Disp: , Rfl:    Coenzyme Q10 100 MG capsule, , Disp: , Rfl:    meloxicam  (MOBIC ) 15 MG tablet, Take 15 mg by mouth daily. (Patient not taking: Reported on 03/06/2020), Disp: , Rfl:    No Known Allergies  Past Medical History:  Diagnosis Date   Arthritis    knee, back, hip     Past Surgical History:  Procedure Laterality Date   COLONOSCOPY  01/15/2013   POLYPECTOMY     thumb surg  8005,8001   right thumb sx x2    Family History  Problem Relation Age of Onset   Transient ischemic attack Mother    Colon cancer Neg Hx    Esophageal cancer Neg Hx    Rectal cancer Neg Hx     Stomach cancer Neg Hx    Colon polyps Neg Hx     Social History   Tobacco Use   Smoking status: Never   Smokeless tobacco: Never  Vaping Use   Vaping status: Never Used  Substance Use Topics   Alcohol use: Yes    Alcohol/week: 20.0 standard drinks of alcohol    Types: 20 Cans of beer per week   Drug use: No    ROS Refer to HPI for ROS details.  Objective:   Vitals: BP (!) 152/86 (BP Location: Right Arm)   Pulse 73   Temp 98.5 F (36.9 C) (Oral)   Resp 16   SpO2 96%   Physical Exam Vitals and nursing note reviewed.  Constitutional:      General: He is not in acute distress.    Appearance: Normal appearance. He is not ill-appearing or toxic-appearing.  HENT:     Head: Normocephalic.  Cardiovascular:     Rate and Rhythm: Normal rate.  Pulmonary:     Effort: Pulmonary effort is normal. No respiratory distress.  Musculoskeletal:     Lumbar back: Spasms and tenderness present. No swelling or bony tenderness. Decreased range of motion. Positive left straight leg raise test. Negative right straight leg raise test.  Skin:    General: Skin is warm and dry.  Capillary Refill: Capillary refill takes less than 2 seconds.  Neurological:     General: No focal deficit present.     Mental Status: He is alert and oriented to person, place, and time.  Psychiatric:        Mood and Affect: Mood normal.        Behavior: Behavior normal.     Procedures  No results found for this or any previous visit (from the past 24 hours).  No results found.   Assessment and Plan :     Discharge Instructions       1. Lumbar strain, initial encounter (Primary) - diclofenac  (VOLTAREN ) 50 MG EC tablet; Take 1 tablet (50 mg total) by mouth 2 (two) times daily.  Dispense: 30 tablet; Refill: 1 - baclofen  (LIORESAL ) 10 MG tablet; Take 1 tablet (10 mg total) by mouth 3 (three) times daily.  Dispense: 30 each; Refill: 0 - No heavy lifting or strenuous physical activity until symptoms  improve. - Continue to apply heat 2-3 times a day for 10 to 15 minutes at a time to help with pain and muscle spasm. -Continue to monitor symptoms for any change in severity if there is any escalation of current symptoms or development of new symptoms follow-up in ER for further evaluation and management.      Ademide Schaberg B Jastin Fore   Cain Fitzhenry, [place] B, [place] 11/01/23 1212

## 2023-11-01 NOTE — Discharge Instructions (Signed)
  1. Lumbar strain, initial encounter (Primary) - diclofenac  (VOLTAREN ) 50 MG EC tablet; Take 1 tablet (50 mg total) by mouth 2 (two) times daily.  Dispense: 30 tablet; Refill: 1 - baclofen  (LIORESAL ) 10 MG tablet; Take 1 tablet (10 mg total) by mouth 3 (three) times daily.  Dispense: 30 each; Refill: 0 - No heavy lifting or strenuous physical activity until symptoms improve. - Continue to apply heat 2-3 times a day for 10 to 15 minutes at a time to help with pain and muscle spasm. -Continue to monitor symptoms for any change in severity if there is any escalation of current symptoms or development of new symptoms follow-up in ER for further evaluation and management.
